# Patient Record
Sex: Male | Born: 1935 | Race: White | Hispanic: No | Marital: Married | Smoking: Former smoker
Health system: Southern US, Community
[De-identification: ages and names within clinical notes are randomized; demographics above are authoritative.]

## PROBLEM LIST (undated history)

## (undated) DIAGNOSIS — M1990 Unspecified osteoarthritis, unspecified site: Secondary | ICD-10-CM

## (undated) DIAGNOSIS — G20 Parkinson's disease: Secondary | ICD-10-CM

## (undated) DIAGNOSIS — G20A1 Dementia in other diseases classified elsewhere, unspecified severity, without behavioral disturbance, psychotic disturbance, mood disturbance, and anxiety: Secondary | ICD-10-CM

## (undated) DIAGNOSIS — F0280 Dementia in other diseases classified elsewhere without behavioral disturbance: Secondary | ICD-10-CM

## (undated) DIAGNOSIS — I680 Cerebral amyloid angiopathy: Secondary | ICD-10-CM

## (undated) DIAGNOSIS — G4752 REM sleep behavior disorder: Secondary | ICD-10-CM

## (undated) DIAGNOSIS — E854 Organ-limited amyloidosis: Secondary | ICD-10-CM

## (undated) DIAGNOSIS — Z974 Presence of external hearing-aid: Secondary | ICD-10-CM

## (undated) HISTORY — PX: COLONOSCOPY: SHX174

## (undated) HISTORY — PX: FINGER SURGERY: SHX640

---

## 2016-09-20 ENCOUNTER — Other Ambulatory Visit: Payer: Self-pay | Admitting: Neurology

## 2016-09-20 DIAGNOSIS — G20 Parkinson's disease: Secondary | ICD-10-CM

## 2016-09-20 DIAGNOSIS — R413 Other amnesia: Secondary | ICD-10-CM

## 2016-09-27 ENCOUNTER — Ambulatory Visit
Admission: RE | Admit: 2016-09-27 | Discharge: 2016-09-27 | Disposition: A | Discharge status: Home / Self Care | Payer: Medicare Other | Source: Ambulatory Visit | Attending: Neurology | Admitting: Neurology

## 2016-09-27 DIAGNOSIS — R413 Other amnesia: Secondary | ICD-10-CM | POA: Diagnosis present

## 2016-09-27 DIAGNOSIS — I6203 Nontraumatic chronic subdural hemorrhage: Secondary | ICD-10-CM | POA: Insufficient documentation

## 2016-09-27 DIAGNOSIS — G20 Parkinson's disease: Secondary | ICD-10-CM

## 2016-12-04 ENCOUNTER — Ambulatory Visit: Payer: Medicare Other | Admitting: Physical Therapy

## 2016-12-04 ENCOUNTER — Ambulatory Visit: Payer: Medicare Other | Admitting: Speech Pathology

## 2016-12-05 ENCOUNTER — Ambulatory Visit: Payer: Medicare Other | Attending: Family Medicine | Admitting: Speech Pathology

## 2016-12-05 DIAGNOSIS — R490 Dysphonia: Secondary | ICD-10-CM | POA: Diagnosis present

## 2016-12-05 DIAGNOSIS — M6281 Muscle weakness (generalized): Secondary | ICD-10-CM | POA: Insufficient documentation

## 2016-12-05 DIAGNOSIS — R262 Difficulty in walking, not elsewhere classified: Secondary | ICD-10-CM | POA: Insufficient documentation

## 2016-12-06 ENCOUNTER — Encounter: Payer: Medicare Other | Admitting: Speech Pathology

## 2016-12-06 ENCOUNTER — Encounter: Payer: Self-pay | Admitting: Speech Pathology

## 2016-12-06 ENCOUNTER — Other Ambulatory Visit: Payer: Self-pay

## 2016-12-06 ENCOUNTER — Ambulatory Visit: Payer: Medicare Other | Admitting: Physical Therapy

## 2016-12-06 NOTE — Therapy (Signed)
[facility] [address] [place], [name], [address] Phone: [phone]   Fax:  [phone]  Speech Language Pathology Evaluation  Patient Details  Name: Darrell Fry MRN: [id number] Date of Birth: [birth year] Referring Provider: Dr. Sherryll BurgerShah   Encounter Date: 12/05/2016  End of Session - 12/06/16 0847    Visit Number  1    Number of Visits  17    Date for SLP Re-Evaluation  01/12/17    SLP Start Time  1500    SLP Stop Time   1558    SLP Time Calculation (min)  58 min    Activity Tolerance  Patient tolerated treatment well       History reviewed. No pertinent past medical history.  History reviewed. No pertinent surgical history.  There were no vitals filed for this visit.      SLP Evaluation [facility] - 12/06/16 0001      SLP Visit Information   SLP Received On  12/05/16    Referring Provider  Dr. Sherryll BurgerShah    Onset Date  09/13/2016    Medical Diagnosis  Parkinsonism       Subjective   Subjective  Patient reports that "only my wife can't hear me"    Patient/Family Stated Goal  Maximize communication      General Information   HPI  83 year old man recently diagnosed with Parkinsonism       Prior Functional Status   Cognitive/Linguistic Baseline  Within functional limits      Oral Motor/Sensory Function   Overall Oral Motor/Sensory Function  Appears within functional limits for tasks assessed      Motor Speech   Overall Motor Speech  Impaired    Respiration  Impaired    Level of Impairment  Conversation    Phonation  Hoarse;Low vocal intensity;Other (comment) Pharyngeal focus    Resonance  Within functional limits    Articulation  Impaired    Level of Impairment  Conversation    Intelligibility  Intelligibility reduced    Conversation  75-100% accurate    Phonation  Impaired    Vocal Abuses  Habitual Cough/Throat Clear    Tension Present  Neck;Shoulder    Volume  Soft    Pitch  Appropriate      Standardized Assessments   Standardized Assessments   Other Assessment LSVT-LOUD Voice Evaluation        LSVT-LOUD Voice Evaluation Maximum phonation time for sustained ah: 10 seconds Mean intensity during sustained ah: 68 dB  Mean intensity sustained during conversational speech: 65 dB Highest dynamic pitch when altering pitch from a low note to a high note: 198 Hz Highest pitch during conversational speech: 146 Hz Lowest dynamic pitch when altering from a high note to a low note: 153 Hz Lowest pitch during conversational speech: 109 Hz  Average fundamental frequency during sustained ah: 131 Hz (within 1 STD for age and gender) Visi-Pitch: Pharmacist, communityMulti-Dimensional Voice Program (MDVP)  MDVP extracts objective quantitative values (Relative Average Perturbation, Shimmer, Voice Turbulence Index, and Noise to Harmonic Ratio) on sustained phonation, which are displayed graphically and numerically in comparison to a built-in normative database.  The patient exhibited values outside the norm for Shimmer.  Average fundamental frequency was average for age and gender. Perceptually, his vocal quality was hoarse and muffled.  The patient improved all parameters when cued to alter voicing (loud like me).  Simulatability: Improved vocal quality with loud voice.   SLP Education - 12/06/16 [id number]  Education provided  Yes    Education Details  LSVT-LOUD protocol    Person(s) Educated  Patient;Spouse    Methods  Explanation    Comprehension  Verbalized understanding         SLP Long Term Goals - 12/06/16 0850      SLP LONG TERM GOAL #1   Title  The patient will complete Daily Tasks (Maximum duration "ah", High/Lows, and Functional Phrases) at average loudness of 80 dB and with loud, good quality voice.     Time  4    Period  Weeks    Status  New    Target Date  01/12/17      SLP LONG TERM GOAL #2   Title  The patient will complete Hierarchal Speech Loudness reading drills (words/phrases,  sentences, and paragraph) at average 75 dB and with loud, good quality voice.      Time  4    Period  Weeks    Status  New    Target Date  01/12/17      SLP LONG TERM GOAL #3   Title  The patient will participate in conversation, maintaining average loudness of 75 dB and loud, good quality voice.    Time  4    Period  Weeks    Status  New    Target Date  01/12/17      SLP LONG TERM GOAL #4   Title  The patient will complete homework daily.    Time  4    Period  Weeks    Status  New    Target Date  01/12/17       Plan - 12/06/16 0848    Clinical Impression Statement  This 83 year old man recently diagnosed with Parkinsonism is presenting with moderate voice disorder characterized by hoarse vocal quality, pharyngeal focus, and monotone voice.  Based on stimulability testing, the patient is judged to be a good candidate for the LSVT LOUD program.  It is recommended that the patient receive the LSVT LOUD program which is comprised of 16 intensive sessions (4 times per week for 4 weeks, one hour sessions).  Prognosis for improvement is good based on motivation, stimulability, and strong family support.  LSVT LOUD has been documented in the literature as efficacious for individuals with Parkinson's disease.      Speech Therapy Frequency  4x / week    Duration  4 weeks    Treatment/Interventions  SLP instruction and feedback;Patient/family education;Other (comment) LSVT-LOUD    Potential to Achieve Goals  Good    Potential Considerations  Ability to learn/carryover information;Severity of impairments;Co-morbidities;Cooperation/participation level;Family/community support;Medical prognosis;Previous level of function    SLP Home Exercise Plan  LSVT-LOUD daily homework    Consulted and Agree with Plan of Care  Patient;Family member/caregiver    Family Member Consulted  Spouse       Patient will benefit from skilled therapeutic intervention in order to improve the following deficits and  impairments:   Dysphonia - Plan: SLP plan of care cert/re-cert  G-Codes - 12/06/16 0851    Functional Assessment Tool Used  LSVT-LOUD Evaluation    Functional Limitations  Voice    Voice Current Status (G9171)  At least 40 percent but less than 60 percent impaired, limited or restricted    Voice Goal Status (G9172)  At least 1 percent but less than 20 percent impaired, limited or restricted       Problem List There are no active problems to display  for this patient.  Dollene Primrose, MS/CCC- SLP  Leandrew Koyanagi 12/06/2016, 8:53 AM  [place] [facility] MAIN [facility] SERVICES [address] [place], [place], [address] Phone: [phone]   Fax:  [phone]  Name: Darrell Fry MRN: [id number] Date of Birth: 08-Jun-1927

## 2016-12-11 ENCOUNTER — Ambulatory Visit: Payer: Medicare Other | Admitting: Physical Therapy

## 2016-12-11 ENCOUNTER — Encounter: Payer: Medicare Other | Admitting: Speech Pathology

## 2016-12-12 ENCOUNTER — Other Ambulatory Visit: Payer: Self-pay

## 2016-12-12 ENCOUNTER — Ambulatory Visit: Payer: Medicare Other | Admitting: Speech Pathology

## 2016-12-12 ENCOUNTER — Ambulatory Visit: Payer: Medicare Other | Admitting: Physical Therapy

## 2016-12-12 ENCOUNTER — Encounter: Payer: Self-pay | Admitting: Physical Therapy

## 2016-12-12 ENCOUNTER — Encounter: Payer: Self-pay | Admitting: Speech Pathology

## 2016-12-12 DIAGNOSIS — M6281 Muscle weakness (generalized): Secondary | ICD-10-CM

## 2016-12-12 DIAGNOSIS — R490 Dysphonia: Secondary | ICD-10-CM

## 2016-12-12 DIAGNOSIS — R262 Difficulty in walking, not elsewhere classified: Secondary | ICD-10-CM

## 2016-12-12 NOTE — Therapy (Signed)
[facility] [address] [place], [name], [address] Phone: [phone]   Fax:  [phone]  Speech Language Pathology Treatment  Patient Details  Name: Darrell Fry MRN: [id number] Date of Birth: June 04, 1927 Referring Provider: Dr. Sherryll BurgerShah   Encounter Date: 12/12/2016  End of Session - 12/12/16 1603    Visit Number  2    Number of Visits  17    Date for SLP Re-Evaluation  01/12/17    SLP Start Time  1500    SLP Stop Time   1553    SLP Time Calculation (min)  53 min    Activity Tolerance  Patient tolerated treatment well       History reviewed. No pertinent past medical history.  History reviewed. No pertinent surgical history.  There were no vitals filed for this visit.  Subjective Assessment - 12/12/16 1602    Subjective  "THat seems a little loud"    Currently in Pain?  No/denies            ADULT SLP TREATMENT - 12/12/16 0001      General Information   Behavior/Cognition  Alert;Cooperative;Pleasant mood    HPI   83 year old man recently diagnosed with Parkinsonism        Treatment Provided   Treatment provided  Cognitive-Linquistic      Pain Assessment   Pain Assessment  No/denies pain      Cognitive-Linquistic Treatment   Treatment focused on  Voice    Skilled Treatment  Daily Task #1 (Maximum sustained "ah"): Average 12 seconds, 83 dB. Daily Task 2 (Maximum fundamental frequency range): Highs: 15 high pitched "ah", given min-mod cues. Lows: 15 low pitched "ah", given min-mod cues. Daily task #3 (Maximum speech loudness drill of functional phrases): Average 72 dB.   Hierarchal speech loudness drill: single word responses, 70 dB. Homework: assignments given.  Off the cuff remarks: average 68 dB.        Assessment / Recommendations / Plan   Plan  Continue with current plan of care      Progression Toward Goals   Progression toward goals  Progressing toward goals       SLP Education - 12/12/16  1603    Education provided  Yes    Education Details  LSVT-LOUD    Person(s) Educated  Patient    Methods  Explanation    Comprehension  Verbalized understanding         SLP Long Term Goals - 12/06/16 0850      SLP LONG TERM GOAL #1   Title  The patient will complete Daily Tasks (Maximum duration "ah", High/Lows, and Functional Phrases) at average loudness of 80 dB and with loud, good quality voice.     Time  4    Period  Weeks    Status  New    Target Date  01/12/17      SLP LONG TERM GOAL #2   Title  The patient will complete Hierarchal Speech Loudness reading drills (words/phrases, sentences, and paragraph) at average 75 dB and with loud, good quality voice.      Time  4    Period  Weeks    Status  New    Target Date  01/12/17      SLP LONG TERM GOAL #3   Title  The patient will participate in conversation, maintaining average loudness of 75 dB and loud, good quality voice.    Time  4  Period  Weeks    Status  New    Target Date  01/12/17      SLP LONG TERM GOAL #4   Title  The patient will complete homework daily.    Time  4    Period  Weeks    Status  New    Target Date  01/12/17       Plan - 12/12/16 1603    Clinical Impression Statement  The patient is completing daily tasks and hierarchal speech drill tasks with loud, good quality voice given SLP cues.      Speech Therapy Frequency  4x / week    Duration  4 weeks    Treatment/Interventions  SLP instruction and feedback;Patient/family education;Other (comment) LSVT-LOUD    Potential to Achieve Goals  Good    Potential Considerations  Ability to learn/carryover information;Severity of impairments;Co-morbidities;Cooperation/participation level;Family/community support;Medical prognosis;Previous level of function    SLP Home Exercise Plan  LSVT-LOUD daily homework    Consulted and Agree with Plan of Care  Patient       Patient will benefit from skilled therapeutic intervention in order to improve the  following deficits and impairments:   Dysphonia    Problem List There are no active problems to display for this patient.  [name], MS/CCC- SLP  [name], [name] 12/12/2016, 4:04 PM  [name], [name], [address] Phone: [phone]   Fax:  [phone]   Name: Darrell Fry MRN: [id number] Date of Birth: 17-Mar-1927

## 2016-12-12 NOTE — Therapy (Signed)
[place] [facility] MAIN [facility] SERVICES [address] [place], [place], [address] Phone: [phone]   Fax:  [phone]  Physical Therapy Evaluation  Patient Details  Name: Darrell Fry MRN: [id number] Date of Birth: October 02, 1927 Referring Provider: Marisue Ivan    Encounter Date: 12/12/2016  PT End of Session - 12/12/16 1412    Visit Number  1    Number of Visits  17    Date for PT Re-Evaluation  01/16/17    Authorization Type  g codes    PT Start Time  0200    PT Stop Time  0300    PT Time Calculation (min)  60 min    Equipment Utilized During Treatment  Gait belt    Activity Tolerance  Patient tolerated treatment well    Behavior During Therapy  [facility] for tasks assessed/performed       History reviewed. No pertinent past medical history.  History reviewed. No pertinent surgical history.  There were no vitals filed for this visit.   Subjective Assessment - 12/12/16 1403    Subjective  Patient reports that he has a short term memory problem and his knees have arthritis. He has slow gait and is unsteady.     Pertinent History  Patient has symptoms of unilateral right hand resting tremor, imbalance, slowness, hunched forward posture, decreased stride length, decreased volume of speech, acting out of dreams.    How long can you stand comfortably?  Patient has limited standing    How long can you walk comfortably?  patient has limited walking of 15 mins    Patient Stated Goals  to be able to walk longer and ascend and descend steps.    Currently in Pain?  Yes    Pain Score  6     Pain Location  Knee    Pain Orientation  Right;Left    Pain Descriptors / Indicators  Aching    Pain Type  Chronic pain    Pain Onset  More than a month ago    Pain Frequency  Intermittent    Aggravating Factors   walking    Pain Relieving Factors  supine and pain medicine    Effect of Pain on Daily Activities  not able to walk very long    Multiple Pain Sites   No         [facility] PT Assessment - 12/12/16 1403      Assessment   Medical Diagnosis  parkinsons    Referring Provider  Marisue Ivan     Onset Date/Surgical Date  11/12/16    Hand Dominance  Right    Prior Therapy  no       Precautions   Precautions  None      Restrictions   Weight Bearing Restrictions  No      Balance Screen   Has the patient fallen in the past 6 months  No    Has the patient had a decrease in activity level because of a fear of falling?   Yes    Is the patient reluctant to leave their home because of a fear of falling?   No      Home Nurse, mental health  Private residence    Living Arrangements  Spouse/significant other    Available Help at Discharge  Family    Type of Home  House    Home Access  Stairs to enter    Entrance Stairs-Number of Steps  1  Entrance Stairs-Rails  None    Home Layout  One level    [employer] - single point;Shower seat      Prior Function   Level of Independence  Independent with basic ADLs;Independent with household mobility without device    Vocation  Retired    Leisure  TV, exercise aquatic      Cognition   Overall Cognitive Status  Within Functional Limits for tasks assessed        POSTURE: Flexed posture  PROM/AROM: WFL  STRENGTH:  Graded on a 0-5 scale Muscle Group Left Right  Shoulder flex 5/5 5/5  Shoulder Abd 5/5 5/5  Shoulder Ext 5/5 5/5  Shoulder IR/ER 5/5 5/5  Elbow 5/5 5/5  Wrist/hand 5/5 5/5  Hip Flex 4/5 4/5  Hip Abd 4/5 4/5  Hip Add 3/5 3/5  Hip Ext 2/5 2/5  Hip IR/ER 4/5 4/5  Knee Flex 5/5 5/5  Knee Ext 5/5 5/5  Ankle DF 5/5 5/5  Ankle PF 3/5 3/5   SENSATION:  WNL   FUNCTIONAL MOBILITY:slow and independent   BALANCE: unable to tandem stand, unable to single leg stand Standing Dynamic Balance  Normal Stand independently unsupported, able to weight shift and cross midline maximally   Good Stand independently unsupported, able to weight shift and cross  midline moderately x  Good-/Fair+ Stand independently unsupported, able to weight shift across midline minimally   Fair Stand independently unsupported, weight shift, and reach ipsilaterally, loss of balance when crossing midline   Poor+ Able to stand with Min A and reach ipsilaterally, unable to weight shift   Poor Able to stand with Mod A and minimally reach ipsilaterally, unable to cross midline.     Static Standing Balance  Normal Able to maintain standing balance against maximal resistance   Good Able to maintain standing balance against moderate resistance x  Good-/Fair+ Able to maintain standing balance against minimal resistance   Fair Able to stand unsupported without UE support and without LOB for 1-2 min   Fair- Requires Min A and UE support to maintain standing without loss of balance   Poor+ Requires mod A and UE support to maintain standing without loss of balance   Poor Requires max A and UE support to maintain standing balance without loss      GAIT: decreased RUE arm swing and fwd flexed posture and decreased gait speed.  OUTCOME MEASURES: TEST Outcome Interpretation  5 times sit<>stand 17 . 32sec >60 yo, >15 sec indicates increased risk for falls  10 meter walk test 1.16                m/s <1.0 m/s indicates increased risk for falls; limited community ambulator  Timed up and Go   13.78,16.43, 20.47              sec <14 sec indicates increased risk for falls  6 minute walk test  930               Feet 1000 feet is community Financial controller 42/56 <36/56 (100% risk for falls), 37-45 (80% risk for falls); 46-51 (>50% risk for falls); 52-55 (lower risk <25% of falls)  DGI 17/24 17/17WNL           Objective measurements completed on examination: See above findings.              PT Education - 12/12/16 1412    Education provided  Yes    Education  Details  plan of care    Person(s) Educated  Patient    Methods  Explanation     Comprehension  Verbalized understanding       PT Short Term Goals - 12/12/16 1444      PT SHORT TERM GOAL #1   Title  Patient will increase BLE gross strength to 4+/5 as to improve functional strength for independent gait, increased standing tolerance and increased ADL ability.    Time  2    Period  Weeks    Status  New    Target Date  12/26/16      PT SHORT TERM GOAL #2   Title  Patient will increase BLE gross strength to 4+/5 as to improve functional strength for independent gait, increased standing tolerance and increased ADL ability.    Baseline  17/24    Time  2    Period  Weeks    Status  New    Target Date  12/26/16        PT Long Term Goals - 12/12/16 1450      PT LONG TERM GOAL #1   Title  Patient will increase Berg Balance score by > 6 points to demonstrate decreased fall risk during functional activities.    Baseline  42/56    Time  4    Period  Weeks    Status  New    Target Date  01/09/17      PT LONG TERM GOAL #2   Title  Patient will reduce timed up and go to <11 seconds to reduce fall risk and demonstrate improved transfer/gait ability.    Time  4    Period  Weeks    Status  New    Target Date  01/09/17      PT LONG TERM GOAL #3   Title  Patient will increase six minute walk test distance to >1000 for progression to community ambulator and improve gait ability    Baseline  930 ft    Time  4    Period  Weeks    Status  New    Target Date  01/09/17      PT LONG TERM GOAL #4   Title  Patient (> 81 years old) will complete five times sit to stand test in < 15 seconds indicating an increased LE strength and improved balance.    Time  4    Period  Weeks    Status  New    Target Date  01/09/17             Plan - 12/12/16 1454    Clinical Impression Statement  Patient is 83 yr old male with Dx of parkinsons disease and presents to PT clinic for LSVT BIG. He has decreased BLE ankle PF strength, decreased dynamic standing balance with decreased  BERG, DGI , 5 x sit to stand, TUG, TUG carry, TUG cognitive and 6 MW test. He has tremor with ambulation and decreased gait speed . He has a falls risk and will benefit from skilled PT to imrpove mobility and decrease his falls risk.     Clinical Presentation  Stable    Clinical Decision Making  Low    Rehab Potential  Good    PT Frequency  4x / week    PT Duration  6 weeks    PT Treatment/Interventions  Gait training;Therapeutic activities;Functional mobility training;Stair training;Therapeutic exercise;Patient/family education;Balance training;Neuromuscular re-education    PT Next Visit Plan  LSVT  BIG    PT Home Exercise Plan  LSVT BIG    Consulted and Agree with Plan of Care  Patient       Patient will benefit from skilled therapeutic intervention in order to improve the following deficits and impairments:  Abnormal gait, Decreased balance, Decreased endurance, Decreased mobility, Decreased activity tolerance, Decreased coordination, Decreased safety awareness, Decreased strength, Impaired flexibility, Pain  Visit Diagnosis: Difficulty in walking, not elsewhere classified - Plan: PT plan of care cert/re-cert  Muscle weakness (generalized) - Plan: PT plan of care cert/re-cert  G-Codes - 12/12/16 1453    Mobility: Walking and Moving Around Current Status (Z6109(G8978)  At least 40 percent but less than 60 percent impaired, limited or restricted    Mobility: Walking and Moving Around Goal Status (U0454(G8979)  At least 20 percent but less than 40 percent impaired, limited or restricted        Problem List There are no active problems to display for this patient.   [address]Elanah Osmanovic S, [place] DPT 12/12/2016, 3:02 PM  [place] [facility] [address] [place], [name], [address] Phone: [phone]   Fax:  [phone]  Name: Darrell Fry MRN: [id number] Date of Birth: 03-20-1927

## 2016-12-13 ENCOUNTER — Ambulatory Visit: Payer: Medicare Other | Admitting: Physical Therapy

## 2016-12-13 ENCOUNTER — Encounter: Payer: Self-pay | Admitting: Physical Therapy

## 2016-12-13 ENCOUNTER — Encounter: Payer: Self-pay | Admitting: Speech Pathology

## 2016-12-13 ENCOUNTER — Other Ambulatory Visit: Payer: Self-pay

## 2016-12-13 ENCOUNTER — Ambulatory Visit: Payer: Medicare Other | Admitting: Speech Pathology

## 2016-12-13 DIAGNOSIS — R490 Dysphonia: Secondary | ICD-10-CM

## 2016-12-13 DIAGNOSIS — R262 Difficulty in walking, not elsewhere classified: Secondary | ICD-10-CM

## 2016-12-13 DIAGNOSIS — M6281 Muscle weakness (generalized): Secondary | ICD-10-CM

## 2016-12-13 NOTE — Therapy (Signed)
[facility] [address] [place], [name], [address] Phone: [phone]   Fax:  [phone]  Speech Language Pathology Treatment  Patient Details  Name: Darrell NormanUwe Economos MRN: [id number] Date of Birth: 02/10/1927 Referring Provider: Dr. Sherryll BurgerShah   Encounter Date: 12/13/2016  End of Session - 12/13/16 1619    Visit Number  3    Number of Visits  17    Date for SLP Re-Evaluation  01/12/17    SLP Start Time  1400    SLP Stop Time   1450    SLP Time Calculation (min)  50 min    Activity Tolerance  Patient tolerated treatment well       History reviewed. No pertinent past medical history.  History reviewed. No pertinent surgical history.  There were no vitals filed for this visit.  Subjective Assessment - 12/12/16 1602    Subjective  "THat seems a little loud"    Currently in Pain?  No/denies            ADULT SLP TREATMENT - 12/13/16 0001      General Information   Behavior/Cognition  Alert;Cooperative;Pleasant mood    HPI   83 year old man recently diagnosed with Parkinsonism        Treatment Provided   Treatment provided  Cognitive-Linquistic      Pain Assessment   Pain Assessment  No/denies pain      Cognitive-Linquistic Treatment   Treatment focused on  Voice    Skilled Treatment  Daily Task #1 (Maximum sustained "ah"): Average 12 seconds, 83 dB. Daily Task 2 (Maximum fundamental frequency range): Highs: 15 high pitched "ah", given min cues. Lows: 15 low pitched "ah", given min cues. Daily task #3 (Maximum speech loudness drill of functional phrases): Average 72 dB.   Hierarchal speech loudness drill: Short spontaneous responses, 70 dB. Homework: assignments given.  Off the cuff remarks: average 70 dB.        Assessment / Recommendations / Plan   Plan  Continue with current plan of care      Progression Toward Goals   Progression toward goals  Progressing toward goals       SLP Education - 12/13/16 1619     Education provided  Yes    Education Details  LSVT-LOUD    Person(s) Educated  Patient    Methods  Explanation    Comprehension  Verbalized understanding         SLP Long Term Goals - 12/06/16 0850      SLP LONG TERM GOAL #1   Title  The patient will complete Daily Tasks (Maximum duration "ah", High/Lows, and Functional Phrases) at average loudness of 80 dB and with loud, good quality voice.     Time  4    Period  Weeks    Status  New    Target Date  01/12/17      SLP LONG TERM GOAL #2   Title  The patient will complete Hierarchal Speech Loudness reading drills (words/phrases, sentences, and paragraph) at average 75 dB and with loud, good quality voice.      Time  4    Period  Weeks    Status  New    Target Date  01/12/17      SLP LONG TERM GOAL #3   Title  The patient will participate in conversation, maintaining average loudness of 75 dB and loud, good quality voice.    Time  4  Period  Weeks    Status  New    Target Date  01/12/17      SLP LONG TERM GOAL #4   Title  The patient will complete homework daily.    Time  4    Period  Weeks    Status  New    Target Date  01/12/17       Plan - 12/13/16 1620    Clinical Impression Statement  The patient is completing daily tasks and hierarchal speech drill tasks with loud, good quality voice given fewer SLP cues.  He demonstrates improved pitch range and requires less cuing for hierarchal speech loudness drill.    Speech Therapy Frequency  4x / week    Duration  4 weeks    Treatment/Interventions  SLP instruction and feedback;Patient/family education;Other (comment) LSVT-LOUD    Potential to Achieve Goals  Good    Potential Considerations  Ability to learn/carryover information;Severity of impairments;Co-morbidities;Cooperation/participation level;Family/community support;Medical prognosis;Previous level of function    SLP Home Exercise Plan  LSVT-LOUD daily homework    Consulted and Agree with Plan of Care  Patient        Patient will benefit from skilled therapeutic intervention in order to improve the following deficits and impairments:   Dysphonia    Problem List There are no active problems to display for this patient.  Dollene PrimroseSusan G Isobelle Tuckett, MS/CCC- SLP  [name], [name] 12/13/2016, 4:21 PM  [facility] [address] [place], [name], [address] Phone: [phone]   Fax:  [phone]   Name: Darrell NormanUwe Chenault MRN: [id number] Date of Birth: 04/06/1927

## 2016-12-13 NOTE — Therapy (Signed)
[facility] [address] [place], [name], [address] Phone: [phone]   Fax:  [phone]  Physical Therapy Treatment  Patient Details  Name: Darrell NormanUwe Sturdy MRN: [id number] Date of Birth: 1927/07/05 Referring Provider: Marisue IvanLINTHAVONG, KANHKA    Encounter Date: 12/13/2016  PT End of Session - 12/13/16 1339    Visit Number  2    Number of Visits  17    Date for PT Re-Evaluation  01/16/17    Authorization Type  g code 2/10    PT Start Time  0105    PT Stop Time  0200    PT Time Calculation (min)  55 min    Equipment Utilized During Treatment  Gait belt    Activity Tolerance  Patient tolerated treatment well    Behavior During Therapy  [facility] for tasks assessed/performed       History reviewed. No pertinent past medical history.  History reviewed. No pertinent surgical history.  There were no vitals filed for this visit.  Subjective Assessment - 12/13/16 1338    Subjective  Patient reports that he has a short term memory problem and his knees have arthritis. He has slow gait and is unsteady.     Pertinent History  Patient has symptoms of unilateral right hand resting tremor, imbalance, slowness, hunched forward posture, decreased stride length, decreased volume of speech, acting out of dreams.    How long can you stand comfortably?  Patient has limited standing    How long can you walk comfortably?  patient has limited walking of 15 mins    Patient Stated Goals  to be able to walk longer and ascend and descend steps.    Currently in Pain?  No/denies    Pain Score  0-No pain    Pain Onset  More than a month ago         [facility] - 12/12/16 1403      Assessment   Medical Diagnosis  parkinsons    Referring Provider  Marisue IvanLINTHAVONG, KANHKA     Onset Date/Surgical Date  11/12/16    Hand Dominance  Right    Prior Therapy  no       Precautions   Precautions  None      Restrictions   Weight Bearing Restrictions   No      Balance Screen   Has the patient fallen in the past 6 months  No    Has the patient had a decrease in activity level because of a fear of falling?   Yes    Is the patient reluctant to leave their home because of a fear of falling?   No      Home Public house managernvironment   Living Environment  Private residence    Living Arrangements  Spouse/significant other    Available Help at Discharge  Family    Type of Home  House    Home Access  Stairs to enter    Entrance Stairs-Number of Steps  1    Entrance Stairs-Rails  None    Home Layout  One level    Home Equipment  [place] - single point;Shower seat      Prior Function   Level of Independence  Independent with basic ADLs;Independent with household mobility without device    Vocation  Retired    Leisure  TV, exercise aquatic      Cognition   Overall Cognitive Status  Within Functional Limits for tasks assessed  Patient seen for LSVT Daily Session Maximal Daily Exercises for facilitation/coordination of movement Sustained movements are designed to rescale the amplitude of movement output for generalization to daily functional activities .Performed as follows for 1 set of 10 repetitions each multidirectional sustained movements  1) Floor to ceiling  2) Side to side multidirectional Repetitive movements performed in standing and are designed to provide retraining effort needed for sustained muscle activation in tasks    3) Step and reach forward  4) Step and reach backwards  5) Step and reach sideways  6) Rock and reach forward/backward  7) Rock and reach sideways 1.Sit to stand functional component task with supervision 5 reps  2.simulated activities for: hand coordination tasks such as buttons  3. Putting his left ankle on his right knee to be able to tie his shoes 4. Standing balance activity for improved standing balance : tapping a 6 inch stool x 20 reps 5. Single leg  Standing activities for being able to wash his feet in the  shower    Max cueing needed to appropriately perform LSVT tasks with leg, hand, and head position. Decreased coordination demonstrated requiring consistent verbal cueing to correct form.                   PT Education - 12/13/16 1339    Education provided  Yes    Education Details  LSVT BIG    Person(s) Educated  Patient    Methods  Explanation    Comprehension  Verbalized understanding       PT Short Term Goals - 12/12/16 1444      PT SHORT TERM GOAL #1   Title  Patient will increase BLE gross strength to 4+/5 as to improve functional strength for independent gait, increased standing tolerance and increased ADL ability.    Time  2    Period  Weeks    Status  New    Target Date  12/26/16      PT SHORT TERM GOAL #2   Title  Patient will increase BLE gross strength to 4+/5 as to improve functional strength for independent gait, increased standing tolerance and increased ADL ability.    Baseline  17/24    Time  2    Period  Weeks    Status  New    Target Date  12/26/16        PT Long Term Goals - 12/12/16 1450      PT LONG TERM GOAL #1   Title  Patient will increase Berg Balance score by > 6 points to demonstrate decreased fall risk during functional activities.    Baseline  42/56    Time  4    Period  Weeks    Status  New    Target Date  01/09/17      PT LONG TERM GOAL #2   Title  Patient will reduce timed up and go to <11 seconds to reduce fall risk and demonstrate improved transfer/gait ability.    Time  4    Period  Weeks    Status  New    Target Date  01/09/17      PT LONG TERM GOAL #3   Title  Patient will increase six minute walk test distance to >1000 for progression to community ambulator and improve gait ability    Baseline  930 ft    Time  4    Period  Weeks    Status  New  Target Date  01/09/17      PT LONG TERM GOAL #4   Title  Patient (> 224 years old) will complete five times sit to stand test in < 15 seconds indicating an  increased LE strength and improved balance.    Time  4    Period  Weeks    Status  New    Target Date  01/09/17            Plan - 12/13/16 1347    Clinical Impression Statement  Max cueing needed to appropriately perform LSVT tasks with leg, hand, and head position. Decreased coordination demonstrated requiring consistent verbal cueing to correct form. Cognitive understanding of task was delayed. Patient continues to demonstrate some in coordination of movement with select exercises such as rock and reach and stepping backwards. Patient responds well to verbal and tactile cues to correct form and technique.  CGA to SBA for safety with activities.  Cues to increase intensity and amplitude of movements throughout session    Rehab Potential  Good    PT Frequency  4x / week    PT Duration  6 weeks    PT Treatment/Interventions  Gait training;Therapeutic activities;Functional mobility training;Stair training;Therapeutic exercise;Patient/family education;Balance training;Neuromuscular re-education    PT Next Visit Plan  LSVT BIG    PT Home Exercise Plan  LSVT BIG    Consulted and Agree with Plan of Care  Patient       Patient will benefit from skilled therapeutic intervention in order to improve the following deficits and impairments:  Abnormal gait, Decreased balance, Decreased endurance, Decreased mobility, Decreased activity tolerance, Decreased coordination, Decreased safety awareness, Decreased strength, Impaired flexibility, Pain  Visit Diagnosis: Difficulty in walking, not elsewhere classified  Muscle weakness (generalized)   G-Codes - 12/12/16 1453    Mobility: Walking and Moving Around Current Status ([id number])  At least 40 percent but less than 60 percent impaired, limited or restricted    Mobility: Walking and Moving Around Goal Status (U0454(G8979)  At least 20 percent but less than 40 percent impaired, limited or restricted       Problem List There are no active problems to  display for this patient.   [address] StreetM[place]field, Brizeyda Holtmeyer S, [place]PT DPT 12/13/2016, 1:48 PM  [facility] [address] [place], [name], [address] Phone: [phone]   Fax:  [phone]  Name: Darrell NormanUwe Cada MRN: [id number] Date of Birth: 1927-10-12

## 2016-12-14 ENCOUNTER — Other Ambulatory Visit: Payer: Self-pay

## 2016-12-14 ENCOUNTER — Ambulatory Visit: Payer: Medicare Other | Admitting: Physical Therapy

## 2016-12-14 ENCOUNTER — Encounter: Payer: Self-pay | Admitting: Speech Pathology

## 2016-12-14 ENCOUNTER — Ambulatory Visit: Payer: Medicare Other | Admitting: Speech Pathology

## 2016-12-14 ENCOUNTER — Encounter: Payer: Self-pay | Admitting: Physical Therapy

## 2016-12-14 DIAGNOSIS — M6281 Muscle weakness (generalized): Secondary | ICD-10-CM

## 2016-12-14 DIAGNOSIS — R490 Dysphonia: Secondary | ICD-10-CM

## 2016-12-14 DIAGNOSIS — R262 Difficulty in walking, not elsewhere classified: Secondary | ICD-10-CM

## 2016-12-14 NOTE — Therapy (Signed)
[facility] [address] [place], [name], [address] Phone: [phone]   Fax:  [phone]  Speech Language Pathology Treatment  Patient Details  Name: Darrell Fry MRN: [id number] Date of Birth: March 17, 1927 Referring Provider: Dr. Sherryll BurgerShah   Encounter Date: 12/14/2016  End of Session - 12/14/16 1502    Visit Number  4    Number of Visits  17    Date for SLP Re-Evaluation  01/12/17    SLP Start Time  1400    SLP Stop Time   1452    SLP Time Calculation (min)  52 min    Activity Tolerance  Patient tolerated treatment well       History reviewed. No pertinent past medical history.  History reviewed. No pertinent surgical history.  There were no vitals filed for this visit.  Subjective Assessment - 12/14/16 1500    Subjective  The patient states that he is trying to be louder    Currently in Pain?  No/denies            ADULT SLP TREATMENT - 12/14/16 0001      General Information   Behavior/Cognition  Alert;Cooperative;Pleasant mood    HPI   83 year old man recently diagnosed with Parkinsonism        Treatment Provided   Treatment provided  Cognitive-Linquistic      Pain Assessment   Pain Assessment  No/denies pain      Cognitive-Linquistic Treatment   Treatment focused on  Voice    Skilled Treatment  Daily Task #1 (Maximum sustained "ah"): Average 12 seconds, 83 dB. Daily Task 2 (Maximum fundamental frequency range): Highs: 15 high pitched "ah", given min cues. Lows: 15 low pitched "ah", given min cues. Daily task #3 (Maximum speech loudness drill of functional phrases): Average 73 dB.   Hierarchal speech loudness drill: Short spontaneous responses, 73 dB. Homework: assignments given.  Off the cuff remarks: average 70 dB.        Assessment / Recommendations / Plan   Plan  Continue with current plan of care      Progression Toward Goals   Progression toward goals  Progressing toward goals       SLP  Education - 12/14/16 1501    Education provided  Yes    Education Details  LSVT-LOUD    Person(s) Educated  Patient    Methods  Explanation    Comprehension  Verbalized understanding         SLP Long Term Goals - 12/06/16 0850      SLP LONG TERM GOAL #1   Title  The patient will complete Daily Tasks (Maximum duration "ah", High/Lows, and Functional Phrases) at average loudness of 80 dB and with loud, good quality voice.     Time  4    Period  Weeks    Status  New    Target Date  01/12/17      SLP LONG TERM GOAL #2   Title  The patient will complete Hierarchal Speech Loudness reading drills (words/phrases, sentences, and paragraph) at average 75 dB and with loud, good quality voice.      Time  4    Period  Weeks    Status  New    Target Date  01/12/17      SLP LONG TERM GOAL #3   Title  The patient will participate in conversation, maintaining average loudness of 75 dB and loud, good quality voice.  Time  4    Period  Weeks    Status  New    Target Date  01/12/17      SLP LONG TERM GOAL #4   Title  The patient will complete homework daily.    Time  4    Period  Weeks    Status  New    Target Date  01/12/17       Plan - 12/14/16 1503    Clinical Impression Statement  The patient is completing daily tasks and hierarchal speech drill tasks with loud, good quality voice given fewer SLP cues.  He demonstrates improved pitch range and requires less cuing for hierarchal speech loudness drill.    Speech Therapy Frequency  4x / week    Duration  4 weeks    Treatment/Interventions  SLP instruction and feedback;Patient/family education;Other (comment) LSVT-LOUD    Potential to Achieve Goals  Good    Potential Considerations  Ability to learn/carryover information;Severity of impairments;Co-morbidities;Cooperation/participation level;Family/community support;Medical prognosis;Previous level of function    SLP Home Exercise Plan  LSVT-LOUD daily homework    Consulted and Agree  with Plan of Care  Patient       Patient will benefit from skilled therapeutic intervention in order to improve the following deficits and impairments:   Dysphonia    Problem List There are no active problems to display for this patient.  Dollene PrimroseSusan G [place] Prue, MS/CCC- SLP  [name], [name] 12/14/2016, 3:03 PM  [place] [address] [place], [name], [address] Phone: [phone]   Fax:  [phone]   Name: Darrell Fry

## 2016-12-14 NOTE — Therapy (Signed)
[facility] [address] [place], [name], [address] Phone: [phone]   Fax:  [phone]  Physical Therapy Treatment  Patient Details  Name: Darrell NormanUwe Delbene MRN: [id number] Date of Birth: 1927-04-23 Referring Provider: Marisue IvanLINTHAVONG, KANHKA    Encounter Date: 12/14/2016  PT End of Session - 12/14/16 1334    Visit Number  3    Number of Visits  17    Date for PT Re-Evaluation  01/16/17    Authorization Type  g code 3/10    PT Start Time  0105    PT Stop Time  0200    PT Time Calculation (min)  55 min    Equipment Utilized During Treatment  Gait belt    Activity Tolerance  Patient tolerated treatment well;Patient limited by fatigue    Behavior During Therapy  [facility]/per[facility]       History reviewed. No pertinent past medical history.  History reviewed. No pertinent surgical history.  There were no vitals filed for this visit.  Subjective Assessment - 12/14/16 1333    Subjective  Patient reports that he has a short term memory problem and his knees have arthritis. He has slow gait and is unsteady.     Pertinent History  Patient has symptoms of unilateral right hand resting tremor, imbalance, slowness, hunched forward posture, decreased stride length, decreased volume of speech, acting out of dreams.    How long can you stand comfortably?  Patient has limited standing    How long can you walk comfortably?  patient has limited walking of 15 mins    Patient Stated Goals  to be able to walk longer and ascend and descend steps.    Currently in Pain?  Yes    Pain Score  2     Pain Location  Knee    Pain Orientation  Right;Left    Pain Descriptors / Indicators  Aching    Pain Type  Chronic pain    Pain Onset  More than a month ago    Pain Frequency  Intermittent    Aggravating Factors   walking    Pain Relieving Factors  lying down and pain medication    Effect of Pain on Daily Activities  not able to walk  as long       Patient seen for LSVT Daily Session Maximal Daily Exercises for facilitation/coordination of movement Sustained movements are designed to rescale the amplitude of movement output for generalization to daily functional activities .Performed as follows for 1 set of 10 repetitions each multidirectional sustained movements  1) Floor to ceiling ; cues to reach fwd  2) Side to side multidirectional Repetitive movements performed in standing and are designed to provide retraining effort needed for sustained muscle activation in tasks   3) Step and reach forward ; cues to step back to finish position correctly with his feet even 4) Step and reach backwards ; cues to lift his toe and bring both UE's back 5) Step and reach sideways; cues to turn his head  6) Rock and reach forward/backward , cues to alternately swing his arms 7) Rock and reach sideways, with twist with cues to pivot on opposite LE 1.Sit to stand functional component task with supervision 5 reps ; cues to reach fwd and to lean fwd 2.simulated activities for: hand coordination tasks such as buttons ; using cards to turn and shuffle 3. Putting his left ankle on his right knee to be able  to tie his shoes x 5 BLE 4. Standing balance activity for improved standing balance : tapping a 6 inch stool x 20 reps, step ups on 6 inch stool 5. Single leg  Standing activities for being able to wash his feet in the shower; in parallel bars without using UE's    Max cueing needed to appropriately perform LSVT tasks with leg, hand, and head position. Decreased coordination demonstrated requiring consistent verbal cueing to correct form.                       PT Education - 12/14/16 1334    Education provided  Yes    Education Details  LSVT BIG    Person(s) Educated  Patient    Methods  Explanation;Demonstration;Tactile cues;Verbal cues    Comprehension  Verbalized understanding;Returned demonstration;Verbal cues  required       PT Short Term Goals - 12/12/16 1444      PT SHORT TERM GOAL #1   Title  Patient will increase BLE gross strength to 4+/5 as to improve functional strength for independent gait, increased standing tolerance and increased ADL ability.    Time  2    Period  Weeks    Status  New    Target Date  12/26/16      PT SHORT TERM GOAL #2   Title  Patient will increase BLE gross strength to 4+/5 as to improve functional strength for independent gait, increased standing tolerance and increased ADL ability.    Baseline  17/24    Time  2    Period  Weeks    Status  New    Target Date  12/26/16        PT Long Term Goals - 12/12/16 1450      PT LONG TERM GOAL #1   Title  Patient will increase Berg Balance score by > 6 points to demonstrate decreased fall risk during functional activities.    Baseline  42/56    Time  4    Period  Weeks    Status  New    Target Date  01/09/17      PT LONG TERM GOAL #2   Title  Patient will reduce timed up and go to <11 seconds to reduce fall risk and demonstrate improved transfer/gait ability.    Time  4    Period  Weeks    Status  New    Target Date  01/09/17      PT LONG TERM GOAL #3   Title  Patient will increase six minute walk test distance to >1000 for progression to community ambulator and improve gait ability    Baseline  930 ft    Time  4    Period  Weeks    Status  New    Target Date  01/09/17      PT LONG TERM GOAL #4   Title  Patient (> 81 years old) will complete five times sit to stand test in < 15 seconds indicating an increased LE strength and improved balance.    Time  4    Period  Weeks    Status  New    Target Date  01/09/17            Plan - 12/14/16 1335    Clinical Impression Statement  Max cueing needed to appropriately  perform LSVT tasks with leg, hand, and head position. Decreased coordination demonstrated requiring consistent verbal cueing to correct  form. Cognitive understanding of task was   delayed. Motor control of LE much improved.  Muscle fatigue but no major pain complaints    Rehab Potential  Good    PT Frequency  4x / week    PT Duration  6 weeks    PT Treatment/Interventions  Gait training;Therapeutic activities;Functional mobility training;Stair training;Therapeutic exercise;Patient/family education;Balance training;Neuromuscular re-education    PT Next Visit Plan  LSVT BIG    PT Home Exercise Plan  LSVT BIG    Consulted and Agree with Plan of Care  Patient       Patient will benefit from skilled therapeutic intervention in order to improve the following deficits and impairments:  Abnormal gait, Decreased balance, Decreased endurance, Decreased mobility, Decreased activity tolerance, Decreased coordination, Decreased safety awareness, Decreased strength, Impaired flexibility, Pain  Visit Diagnosis: Difficulty in walking, not elsewhere classified  Muscle weakness (generalized)     Problem List There are no active problems to display for this patient.   [address] [place] DPT 12/14/2016, 1:37 PM  [facility] MAIN [facility] SERVICES [address] [place], [place], [address] Phone: [phone]   Fax:  [phone]  Name: Remiel Corti MRN: [id number] Date of Birth: Aug 24, 1927

## 2016-12-18 ENCOUNTER — Encounter: Payer: Self-pay | Admitting: Speech Pathology

## 2016-12-18 ENCOUNTER — Ambulatory Visit: Payer: Medicare Other | Admitting: Physical Therapy

## 2016-12-18 ENCOUNTER — Ambulatory Visit: Payer: Medicare Other | Admitting: Speech Pathology

## 2016-12-18 ENCOUNTER — Other Ambulatory Visit: Payer: Self-pay

## 2016-12-18 ENCOUNTER — Encounter: Payer: Self-pay | Admitting: Physical Therapy

## 2016-12-18 DIAGNOSIS — M6281 Muscle weakness (generalized): Secondary | ICD-10-CM

## 2016-12-18 DIAGNOSIS — R262 Difficulty in walking, not elsewhere classified: Secondary | ICD-10-CM

## 2016-12-18 DIAGNOSIS — R490 Dysphonia: Secondary | ICD-10-CM | POA: Diagnosis not present

## 2016-12-18 NOTE — Therapy (Signed)
[facility] [address] [place], [name], [address] Phone: [phone]   Fax:  [phone]  Speech Language Pathology Treatment  Patient Details  Name: Darrell Fry MRN: [id number] Date of Birth: 28-Mar-1927 Referring Provider: Dr. Sherryll BurgerShah   Encounter Date: 12/18/2016  End of Session - 12/18/16 1502    Visit Number  5    Number of Visits  17    Date for SLP Re-Evaluation  01/12/17    SLP Start Time  1400    SLP Stop Time   1452    SLP Time Calculation (min)  52 min    Activity Tolerance  Patient tolerated treatment well       History reviewed. No pertinent past medical history.  History reviewed. No pertinent surgical history.  There were no vitals filed for this visit.  Subjective Assessment - 12/18/16 1501    Subjective  The patient states that he is trying to be louder    Currently in Pain?  No/denies            ADULT SLP TREATMENT - 12/18/16 0001      General Information   Behavior/Cognition  Alert;Cooperative;Pleasant mood    HPI   83 year old man recently diagnosed with Parkinsonism        Treatment Provided   Treatment provided  Cognitive-Linquistic      Pain Assessment   Pain Assessment  No/denies pain      Cognitive-Linquistic Treatment   Treatment focused on  Voice    Skilled Treatment  Daily Task #1 (Maximum sustained "ah"): Average 9 seconds, 80 dB. Daily Task 2 (Maximum fundamental frequency range): Highs: 15 high pitched "ah", given min cues. Lows: 15 low pitched "ah", given min cues. Daily task #3 (Maximum speech loudness drill of functional phrases): Average 73 dB.   Hierarchal speech loudness drill: Short spontaneous responses, 73 dB. Homework: assignments given.  Off the cuff remarks: average 70 dB.        Assessment / Recommendations / Plan   Plan  Continue with current plan of care      Progression Toward Goals   Progression toward goals  Progressing toward goals       SLP  Education - 12/18/16 1502    Education provided  Yes    Education Details  LSVT-LOUD    Person(s) Educated  Patient    Methods  Explanation    Comprehension  Verbalized understanding         SLP Long Term Goals - 12/06/16 0850      SLP LONG TERM GOAL #1   Title  The patient will complete Daily Tasks (Maximum duration "ah", High/Lows, and Functional Phrases) at average loudness of 80 dB and with loud, good quality voice.     Time  4    Period  Weeks    Status  New    Target Date  01/12/17      SLP LONG TERM GOAL #2   Title  The patient will complete Hierarchal Speech Loudness reading drills (words/phrases, sentences, and paragraph) at average 75 dB and with loud, good quality voice.      Time  4    Period  Weeks    Status  New    Target Date  01/12/17      SLP LONG TERM GOAL #3   Title  The patient will participate in conversation, maintaining average loudness of 75 dB and loud, good quality voice.  Time  4    Period  Weeks    Status  New    Target Date  01/12/17      SLP LONG TERM GOAL #4   Title  The patient will complete homework daily.    Time  4    Period  Weeks    Status  New    Target Date  01/12/17       Plan - 12/18/16 1502    Clinical Impression Statement  The patient is completing daily tasks and hierarchal speech drill tasks with loud, good quality voice given fewer SLP cues.  He demonstrates improved pitch range and requires less cuing for hierarchal speech loudness drill.    Speech Therapy Frequency  4x / week    Duration  4 weeks    Treatment/Interventions  SLP instruction and feedback;Patient/family education;Other (comment) LSVT-LOUD    Potential to Achieve Goals  Good    Potential Considerations  Ability to learn/carryover information;Severity of impairments;Co-morbidities;Cooperation/participation level;Family/community support;Medical prognosis;Previous level of function    SLP Home Exercise Plan  LSVT-LOUD daily homework    Consulted and Agree  with Plan of Care  Patient       Patient will benefit from skilled therapeutic intervention in order to improve the following deficits and impairments:   Dysphonia    Problem List There are no active problems to display for this patient.  Dollene PrimroseSusan G Lakyra Tippins, MS/CCC- SLP  [name], [name] 12/18/2016, 3:03 PM  [facility] [address] [place], [name], [address] Phone: [phone]   Fax:  [phone]   Name: Darrell NormanUwe Fry MRN: [id number] Date of Birth: Aug 20, 1927

## 2016-12-18 NOTE — Therapy (Signed)
[facility] MAIN [facility] SERVICES [address] [place], [place], [address] Phone: [phone]   Fax:  [phone]  Physical Therapy Treatment  Patient Details  Name: Darrell Fry MRN: [id number] Date of Birth: Apr 15, 1927 Referring Provider: Marisue Ivan    Encounter Date: 12/18/2016  PT End of Session - 12/18/16 1310    Visit Number  4    Number of Visits  17    Date for PT Re-Evaluation  01/16/17    Authorization Type  g code 4/10    PT Start Time  0103    PT Stop Time  0200    PT Time Calculation (min)  57 min    Equipment Utilized During Treatment  Gait belt    Activity Tolerance  Patient tolerated treatment well;Patient limited by fatigue    Behavior During Therapy  [facility] for tasks assessed/performed       History reviewed. No pertinent past medical history.  History reviewed. No pertinent surgical history.  There were no vitals filed for this visit.  Subjective Assessment - 12/18/16 1308    Subjective  Patient reports that he has a short term memory problem and his knees have arthritis. He has slow gait and is unsteady. He reports that he was able to do his exercise 2 x a day over the last 3 days.     Pertinent History  Patient has symptoms of unilateral right hand resting tremor, imbalance, slowness, hunched forward posture, decreased stride length, decreased volume of speech, acting out of dreams.    How long can you stand comfortably?  Patient has limited standing    How long can you walk comfortably?  patient has limited walking of 15 mins    Patient Stated Goals  to be able to walk longer and ascend and descend steps.    Currently in Pain?  No/denies    Pain Score  0-No pain    Pain Onset  More than a month ago         Patient seen for LSVT Daily Session Maximal Daily Exercises for facilitation/coordination of movement Sustained movements are designed to rescale the amplitude of movement output for generalization to  daily functional activities .Performed as follows for 1 set of 10 repetitions each multidirectional sustained movements  1) Floor to ceiling ; cues to reach fwd and to lean BIG 2) Side to side multidirectional Repetitive movements performed in standing and are designed to provide retraining effort needed for sustained muscle activation in tasks ; cues to stretch out his leg BIG 3) Step and reach forward ; cues to step back to finish position correctly with his feet even and not to take such a big step that he is getting  Off balance 4) Step and reach backwards ; cues to lift his toe and bring both UE's back 5) Step and reach sideways; cues to turn his head and raise both UE's 6) Rock and reach forward/backward , cues to alternately swing his arms, start by rocking and then adding in the arm swing 7) Rock and reach sideways, with twist with cues to pivot on opposite LE cue to look behind 1.Sit to stand functional component task with supervision 5 reps; cues to reach fwd and to lean fwd, cues for hand up, stand up 2.simulated activities for: hand coordination tasks such as buttons; using cards to turn and shuffle 3. Putting his left ankle on his right knee to be able to tie his shoes x 5 BLE 4.  Standing balance activity for improved standing balance : tapping a 6 inch stool x 20 reps, step ups on 6 inch stool 5. Single leg Standing activities for being able to wash his feet in the shower; in parallel bars without using UE's  Gait with BIG swing 1000 feet with cues to swing big   Max cueing needed to appropriately perform LSVT tasks with leg, hand, and head position. Decreased coordination demonstrated requiring consistent verbal cueing to correct form.Needs cues to turn his head to look at his hand and then to turn his head back to start position. Needs cues to rotate his arms all the way so his palms are facing the ceiling.                      PT Education - 12/18/16  1309    Education provided  Yes    Education Details  LSVT BIG    Person(s) Educated  Patient    Methods  Explanation;Demonstration    Comprehension  Verbalized understanding;Returned demonstration;Verbal cues required;Tactile cues required       PT Short Term Goals - 12/12/16 1444      PT SHORT TERM GOAL #1   Title  Patient will increase BLE gross strength to 4+/5 as to improve functional strength for independent gait, increased standing tolerance and increased ADL ability.    Time  2    Period  Weeks    Status  New    Target Date  12/26/16      PT SHORT TERM GOAL #2   Title  Patient will increase BLE gross strength to 4+/5 as to improve functional strength for independent gait, increased standing tolerance and increased ADL ability.    Baseline  17/24    Time  2    Period  Weeks    Status  New    Target Date  12/26/16        PT Long Term Goals - 12/12/16 1450      PT LONG TERM GOAL #1   Title  Patient will increase Berg Balance score by > 6 points to demonstrate decreased fall risk during functional activities.    Baseline  42/56    Time  4    Period  Weeks    Status  New    Target Date  01/09/17      PT LONG TERM GOAL #2   Title  Patient will reduce timed up and go to <11 seconds to reduce fall risk and demonstrate improved transfer/gait ability.    Time  4    Period  Weeks    Status  New    Target Date  01/09/17      PT LONG TERM GOAL #3   Title  Patient will increase six minute walk test distance to >1000 for progression to community ambulator and improve gait ability    Baseline  930 ft    Time  4    Period  Weeks    Status  New    Target Date  01/09/17      PT LONG TERM GOAL #4   Title  Patient (> 81 years old) will complete five times sit to stand test in < 15 seconds indicating an increased LE strength and improved balance.    Time  4    Period  Weeks    Status  New    Target Date  01/09/17  Plan - 12/18/16 1311    Clinical  Impression Statement  Continues to have balance deficits typical with diagnosis. Patient performs intermediate level exercises without pain behaviors and needs verbal cuing for postural alignment and head positioning. Cuing is needed to stretch the leg out in seated side reaching. Fatigue with sit to stand but demonstrating more control .    Rehab Potential  Good    PT Frequency  4x / week    PT Duration  6 weeks    PT Treatment/Interventions  Gait training;Therapeutic activities;Functional mobility training;Stair training;Therapeutic exercise;Patient/family education;Balance training;Neuromuscular re-education    PT Next Visit Plan  LSVT BIG    PT Home Exercise Plan  LSVT BIG    Consulted and Agree with Plan of Care  Patient       Patient will benefit from skilled therapeutic intervention in order to improve the following deficits and impairments:  Abnormal gait, Decreased balance, Decreased endurance, Decreased mobility, Decreased activity tolerance, Decreased coordination, Decreased safety awareness, Decreased strength, Impaired flexibility, Pain  Visit Diagnosis: Difficulty in walking, not elsewhere classified  Muscle weakness (generalized)     Problem List There are no active problems to display for this patient.   [address]Tiaja Hagan S, [place],  DPT  12/18/2016, 1:12 PM  [place] [facility] [address] [place], [name], [address] Phone: [phone]   Fax:  [phone]  Name: Darrell NormanUwe Fry MRN: [id number] Date of Birth: 11-18-1927

## 2016-12-19 ENCOUNTER — Ambulatory Visit: Payer: Medicare Other | Admitting: Physical Therapy

## 2016-12-19 ENCOUNTER — Encounter: Payer: Self-pay | Admitting: Physical Therapy

## 2016-12-19 ENCOUNTER — Ambulatory Visit: Payer: Medicare Other | Admitting: Speech Pathology

## 2016-12-19 DIAGNOSIS — R490 Dysphonia: Secondary | ICD-10-CM | POA: Diagnosis not present

## 2016-12-19 DIAGNOSIS — R262 Difficulty in walking, not elsewhere classified: Secondary | ICD-10-CM

## 2016-12-19 DIAGNOSIS — M6281 Muscle weakness (generalized): Secondary | ICD-10-CM

## 2016-12-19 NOTE — Therapy (Signed)
[place]IN [facility] [address] [place], [name], [address] Phone: [phone]   Fax:  [phone]  Physical Therapy Treatment  Patient Details  Name: Darrell Fry MRN: [id number] Date of Birth: [birth year] Referring Provider: Marisue IvanLINTHAVONG, KANHKA    Encounter Date: 12/19/2016  PT End of Session - 12/19/16 1313    Visit Number  5    Number of Visits  17    Date for PT Re-Evaluation  01/16/17    Authorization Type  g code 5/10    PT Start Time  0103    PT Stop Time  0200    PT Time Calculation (min)  57 min    Equipment Utilized During Treatment  Gait belt    Activity Tolerance  Patient tolerated treatment well;Patient limited by fatigue    Behavior During Therapy  [facility]WFL for tasks assessed/performed       History reviewed. No pertinent past medical history.  History reviewed. No pertinent surgical history.  There were no vitals filed for this visit.  Subjective Assessment - 12/19/16 1311    Subjective  Patient is doing his HEP and has some soreness in his knees.     Pertinent History  Patient has symptoms of unilateral right hand resting tremor, imbalance, slowness, hunched forward posture, decreased stride length, decreased volume of speech, acting out of dreams.    How long can you stand comfortably?  Patient has limited standing    How long can you walk comfortably?  patient has limited walking of 15 mins    Patient Stated Goals  to be able to walk longer and ascend and descend steps.    Currently in Pain?  Yes    Pain Score  2     Pain Location  Knee    Pain Orientation  Right;Left    Pain Descriptors / Indicators  Aching    Pain Type  Chronic pain    Pain Onset  More than a month ago    Pain Frequency  Intermittent    Aggravating Factors   walking    Pain Relieving Factors  pain medication    Effect of Pain on Daily Activities  needs to sit more    Multiple Pain Sites  No        Patient seen for LSVT Daily Session  Maximal Daily Exercises for facilitation/coordination of movement Sustained movements are designed to rescale the amplitude of movement output for generalization to daily functional activities .Performed as follows for 1 set of 10 repetitions each multidirectional sustained movements  1) Floor to ceiling; cues to reach fwdand to lean BIG 2) Side to side multidirectional Repetitive movements performed in standing and are designed to provide retraining effort needed for sustained muscle activation in tasks ; cues to stretch out his leg BIG 3) Step and reach forward; cues to step back to finish position correctly with his feet even and not to take such a big step that he is getting  Off balance 4) Step and reach backwards; cues to lift his toe and bring both UE's back 5) Step and reach sideways; cues to turn his head and raise both UE's 6) Rock and reach forward/backward, cues to alternately swing his arms, start by rocking and then adding in the arm swing 7) Rock and reach sideways, with twist with cues to pivot on opposite LE cue to look behind 1.Sit to stand functional component task with supervision 5 reps; cues to reach fwd and to lean  fwd, cues for hand up, stand up 2.simulated activities for: hand coordination tasks such as buttons; using cards to turn and shuffle 3. Putting his left ankle on his right knee to be able to tie his shoesx 5 BLE 4. Standing balance activity for improved standing balance : tapping a 6 inch stool x 20 reps, step ups on 6 inch stool 5. Single leg Standing activities for being able to wash his feet in the shower; in parallel bars without using UE's  Gait with BIG swing 1000 feet with cues to swing big   Max cueing needed to appropriately perform LSVT tasks with leg, hand, and head position. Decreased coordination demonstrated requiring consistent verbal cueing to correct form.Needs cues to turn his head to look at his hand and then to turn his head back  to start position. Needs cues to rotate his arms all the way so his palms are facing the ceiling                        PT Education - 12/19/16 1312    Education provided  Yes    Education Details  LSVT BIG    Person(s) Educated  Patient    Methods  Explanation;Tactile cues;Demonstration    Comprehension  Verbalized understanding;Returned demonstration       PT Short Term Goals - 12/12/16 1444      PT SHORT TERM GOAL #1   Title  Patient will increase BLE gross strength to 4+/5 as to improve functional strength for independent gait, increased standing tolerance and increased ADL ability.    Time  2    Period  Weeks    Status  New    Target Date  12/26/16      PT SHORT TERM GOAL #2   Title  Patient will increase BLE gross strength to 4+/5 as to improve functional strength for independent gait, increased standing tolerance and increased ADL ability.    Baseline  17/24    Time  2    Period  Weeks    Status  New    Target Date  12/26/16        PT Long Term Goals - 12/12/16 1450      PT LONG TERM GOAL #1   Title  Patient will increase Berg Balance score by > 6 points to demonstrate decreased fall risk during functional activities.    Baseline  42/56    Time  4    Period  Weeks    Status  New    Target Date  01/09/17      PT LONG TERM GOAL #2   Title  Patient will reduce timed up and go to <11 seconds to reduce fall risk and demonstrate improved transfer/gait ability.    Time  4    Period  Weeks    Status  New    Target Date  01/09/17      PT LONG TERM GOAL #3   Title  Patient will increase six minute walk test distance to >1000 for progression to community ambulator and improve gait ability    Baseline  930 ft    Time  4    Period  Weeks    Status  New    Target Date  01/09/17      PT LONG TERM GOAL #4   Title  Patient (83 years old) will complete five times sit to stand test in < 15 seconds indicating an  increased LE strength and improved  balance.    Time  4    Period  Weeks    Status  New    Target Date  01/09/17            Plan - 12/19/16 1314    Clinical Impression Statement  Max cueing needed to appropriately perform LSVT tasks with leg, hand, and head position. Decreased coordination demonstrated requiring consistent verbal cueing to correct form. Cognitive understanding of task was delayed. Patient continues to demonstrate some in coordination of movement with select exercises such as rock and reach and stepping backwards. Patient responds well to verbal and tactile cues to correct form and technique. CGA to SBA for safety with activities. Cues to increase intensity and amplitude of movements throughout session    Rehab Potential  Good    PT Frequency  4x / week    PT Duration  6 weeks    PT Treatment/Interventions  Gait training;Therapeutic activities;Functional mobility training;Stair training;Therapeutic exercise;Patient/family education;Balance training;Neuromuscular re-education    PT Next Visit Plan  LSVT BIG    PT Home Exercise Plan  LSVT BIG    Consulted and Agree with Plan of Care  Patient       Patient will benefit from skilled therapeutic intervention in order to improve the following deficits and impairments:  Abnormal gait, Decreased balance, Decreased endurance, Decreased mobility, Decreased activity tolerance, Decreased coordination, Decreased safety awareness, Decreased strength, Impaired flexibility, Pain  Visit Diagnosis: Difficulty in walking, not elsewhere classified  Muscle weakness (generalized)     Problem List There are no active problems to display for this patient.   [address], [place] Binford S, [place], DPT 12/19/2016, 1:16 PM  [place] [facility] [address] [place], [name], [address] Phone: [phone]   Fax:  [phone]  Name: Darrell Fry MRN: [id number] Date of Birth: 07-Jul-1927

## 2016-12-20 ENCOUNTER — Encounter: Payer: Self-pay | Admitting: Speech Pathology

## 2016-12-20 ENCOUNTER — Ambulatory Visit: Payer: Medicare Other | Admitting: Physical Therapy

## 2016-12-20 ENCOUNTER — Ambulatory Visit: Payer: Medicare Other | Admitting: Speech Pathology

## 2016-12-20 ENCOUNTER — Encounter: Payer: Self-pay | Admitting: Physical Therapy

## 2016-12-20 ENCOUNTER — Other Ambulatory Visit: Payer: Self-pay

## 2016-12-20 DIAGNOSIS — R262 Difficulty in walking, not elsewhere classified: Secondary | ICD-10-CM

## 2016-12-20 DIAGNOSIS — R490 Dysphonia: Secondary | ICD-10-CM

## 2016-12-20 DIAGNOSIS — M6281 Muscle weakness (generalized): Secondary | ICD-10-CM

## 2016-12-20 NOTE — Therapy (Addendum)
[employer] [facility] [address] [place], [name], [address] Phone: [phone]   Fax:  [phone]  Physical Therapy Treatment  Patient Details  Name: Darrell Fry MRN: [id number] Date of Birth: [birth year] Referring Provider: Marisue IvanLINTHAVONG, KANHKA    Encounter Date: 12/20/2016  PT End of Session - 12/20/16 1310    Visit Number  6    Number of Visits  17    Date for PT Re-Evaluation  01/16/17    Authorization Type  g code 6/10    PT Start Time  0100    PT Stop Time  0200    PT Time Calculation (min)  60 min    Equipment Utilized During Treatment  Gait belt    Activity Tolerance  Patient tolerated treatment well;Patient limited by fatigue    Behavior During Therapy  [facility] for tasks assessed/performed       History reviewed. No pertinent past medical history.  History reviewed. No pertinent surgical history.  There were no vitals filed for this visit.  Subjective Assessment - 12/20/16 1309    Subjective  Patient is doing his HEP and has some soreness in his knees.     Pertinent History  Patient has symptoms of unilateral right hand resting tremor, imbalance, slowness, hunched forward posture, decreased stride length, decreased volume of speech, acting out of dreams.    How long can you stand comfortably?  Patient has limited standing    How long can you walk comfortably?  patient has limited walking of 15 mins    Patient Stated Goals  to be able to walk longer and ascend and descend steps.    Currently in Pain?  Yes    Pain Score  2     Pain Location  Knee    Pain Orientation  Right;Left    Pain Descriptors / Indicators  Aching    Pain Type  Chronic pain    Pain Onset  More than a month ago    Pain Frequency  Intermittent    Aggravating Factors   walking    Pain Relieving Factors  pain medication    Multiple Pain Sites  No       Patient seen for LSVT Daily Session Maximal Daily Exercises for facilitation/coordination of  movement Sustained movements are designed to rescale the amplitude of movement output for generalization to daily functional activities .Performed as follows for 1 set of 10 repetitions each multidirectional sustained movements  1) Floor to ceiling; cues to reach fwdand to lean BIG 2) Side to side multidirectional Repetitive movements performed in standing and are designed to provide retraining effort needed for sustained muscle activation in tasks; cues to stretch out his leg BIG 3) Step and reach forward; cues to step back to finish position correctly with his feet evenand not to take such a big step that he is getting Off balance 4) Step and reach backwards; cues to lift his toe and bring both UE's back 5) Step and reach sideways; cues to turn his headand raise both UE's 6) Rock and reach forward/backward, cues to alternately swing his arms, start by rocking and then adding in the arm swing 7) Rock and reach sideways, with twist with cues to pivot on opposite LEcue to look behind 1.Sit to stand functional component task with supervision 5 reps; cues to reach fwd and to lean fwd, cues for hand up, stand up 2.simulated activities for: hand coordination tasks such as buttons; using cards to turn  and shuffle 3. Putting his left ankle on his right knee to be able to tie his shoesx 5 BLE 4. Standing balance activity for improved standing balance : tapping a 6 inch stool x 20 reps, step ups on 6 inch stool 5. Single leg Standing activities for being able to wash his feet in the shower; in parallel bars without using UE's  Gait with BIG swing 1000 feet with cues to swing big Patient needs cueing for BIG swing and BIG amplitude.  Patient needs cueing for correct BIG arm swing. Patient  improves with practice. Patient needs cuing to consistently swing arms and also swing reciprocally. Patient needs cuing to swing his UE's reciprocally with weight shift exercise and with backward stepping  and correct UE positioning. Patient has unsteady stepping with several exercises but his motor control improve with practice. Patient needs occasional verbal cueing to improve posture and cueing to correctly perform exercises slowly, holding at end of range to increase motor firing of desired muscle to encourage fatigue.                        PT Education - 12/20/16 1310    Education provided  Yes    Education Details  LSVT BIG    Person(s) Educated  Patient    Methods  Demonstration;Explanation    Comprehension  Verbalized understanding;Returned demonstration       PT Short Term Goals - 12/12/16 1444      PT SHORT TERM GOAL #1   Title  Patient will increase BLE gross strength to 4+/5 as to improve functional strength for independent gait, increased standing tolerance and increased ADL ability.    Time  2    Period  Weeks    Status  New    Target Date  12/26/16      PT SHORT TERM GOAL #2   Title  Patient will increase BLE gross strength to 4+/5 as to improve functional strength for independent gait, increased standing tolerance and increased ADL ability.    Baseline  17/24    Time  2    Period  Weeks    Status  New    Target Date  12/26/16        PT Long Term Goals - 12/12/16 1450      PT LONG TERM GOAL #1   Title  Patient will increase Berg Balance score by > 6 points to demonstrate decreased fall risk during functional activities.    Baseline  42/56    Time  4    Period  Weeks    Status  New    Target Date  01/09/17      PT LONG TERM GOAL #2   Title  Patient will reduce timed up and go to <11 seconds to reduce fall risk and demonstrate improved transfer/gait ability.    Time  4    Period  Weeks    Status  New    Target Date  01/09/17      PT LONG TERM GOAL #3   Title  Patient will increase six minute walk test distance to >1000 for progression to community ambulator and improve gait ability    Baseline  930 ft    Time  4    Period  Weeks     Status  New    Target Date  01/09/17      PT LONG TERM GOAL #4   Title  Patient (> 60  years old) will complete five times sit to stand test in < 15 seconds indicating an increased LE strength and improved balance.    Time  4    Period  Weeks    Status  New    Target Date  01/09/17            Plan - 12/20/16 1312    Clinical Impression Statement  Patient has difficulty with turning  head and rotating  trunk with weight shifting exercises. Patient has fatigue with standing exercises and needs constant VC to have correct posture. Patient has loss of balance and needs UE support intermittently thorough out exercise. Patient has slowness of movement during rotation and beginning movements.    Rehab Potential  Good    PT Frequency  4x / week    PT Duration  6 weeks    PT Treatment/Interventions  Gait training;Therapeutic activities;Functional mobility training;Stair training;Therapeutic exercise;Patient/family education;Balance training;Neuromuscular re-education    PT Next Visit Plan  LSVT BIG    PT Home Exercise Plan  LSVT BIG    Consulted and Agree with Plan of Care  Patient       Patient will benefit from skilled therapeutic intervention in order to improve the following deficits and impairments:  Abnormal gait, Decreased balance, Decreased endurance, Decreased mobility, Decreased activity tolerance, Decreased coordination, Decreased safety awareness, Decreased strength, Impaired flexibility, Pain  Visit Diagnosis: Difficulty in walking, not elsewhere classified  Muscle weakness (generalized)     Problem List There are no active problems to display for this patient.   [address] [place] DPT 12/20/2016, 1:13 PM  [place] [facility] MAIN [facility] SERVICES [address] [place], [place], [address] Phone: [phone]   Fax:  [phone]  Name: Darrell Fry MRN: [id number] Date of Birth: [birth year]

## 2016-12-20 NOTE — Therapy (Signed)
[place] [facility] [address] [place], [name], [address] Phone: [phone]   Fax:  [phone]  Speech Language Pathology Treatment  Patient Details  Name: Darrell NormanUwe Mittelstaedt MRN: [id number] Date of Birth: 1927/10/24 Referring Provider: Dr. Sherryll BurgerShah   Encounter Date: 12/19/2016  End of Session - 12/20/16 1233    Visit Number  6    Number of Visits  17    Date for SLP Re-Evaluation  01/12/17    SLP Start Time  1400    SLP Stop Time   1453    SLP Time Calculation (min)  53 min    Activity Tolerance  Patient tolerated treatment well       History reviewed. No pertinent past medical history.  History reviewed. No pertinent surgical history.  There were no vitals filed for this visit.  Subjective Assessment - 12/20/16 1232    Subjective  The patient states that he is trying to be louder    Currently in Pain?  No/denies            ADULT SLP TREATMENT - 12/20/16 0001      General Information   Behavior/Cognition  Alert;Cooperative;Pleasant mood    HPI   83 year old man recently diagnosed with Parkinsonism        Treatment Provided   Treatment provided  Cognitive-Linquistic      Pain Assessment   Pain Assessment  No/denies pain      Cognitive-Linquistic Treatment   Treatment focused on  Voice    Skilled Treatment  Daily Task #1 (Maximum sustained "ah"): Average 9 seconds, 84 dB. Daily Task 2 (Maximum fundamental frequency range): Highs: 15 high pitched "ah", given min cues. Lows: 15 low pitched "ah", given min cues. Daily task #3 (Maximum speech loudness drill of functional phrases): Average 73 dB.   Hierarchal speech loudness drill: Short spontaneous responses, 73 dB. Homework: assignments completed.  Off the cuff remarks: average 70 dB.        Assessment / Recommendations / Plan   Plan  Continue with current plan of care      Progression Toward Goals   Progression toward goals  Progressing toward goals       SLP  Education - 12/20/16 1232    Education provided  Yes    Education Details  LSVT-LOUD    Person(s) Educated  Patient    Methods  Explanation    Comprehension  Verbalized understanding         SLP Long Term Goals - 12/06/16 0850      SLP LONG TERM GOAL #1   Title  The patient will complete Daily Tasks (Maximum duration "ah", High/Lows, and Functional Phrases) at average loudness of 80 dB and with loud, good quality voice.     Time  4    Period  Weeks    Status  New    Target Date  01/12/17      SLP LONG TERM GOAL #2   Title  The patient will complete Hierarchal Speech Loudness reading drills (words/phrases, sentences, and paragraph) at average 75 dB and with loud, good quality voice.      Time  4    Period  Weeks    Status  New    Target Date  01/12/17      SLP LONG TERM GOAL #3   Title  The patient will participate in conversation, maintaining average loudness of 75 dB and loud, good quality voice.  Time  4    Period  Weeks    Status  New    Target Date  01/12/17      SLP LONG TERM GOAL #4   Title  The patient will complete homework daily.    Time  4    Period  Weeks    Status  New    Target Date  01/12/17       Plan - 12/20/16 1233    Clinical Impression Statement  The patient is completing daily tasks and hierarchal speech drill tasks with loud, good quality voice given fewer SLP cues.  He demonstrates improved pitch range and requires less cuing for hierarchal speech loudness drill.    Speech Therapy Frequency  4x / week    Duration  4 weeks    Treatment/Interventions  SLP instruction and feedback;Patient/family education;Other (comment) LSVT-LOUD    Potential to Achieve Goals  Good    Potential Considerations  Ability to learn/carryover information;Severity of impairments;Co-morbidities;Cooperation/participation level;Family/community support;Medical prognosis;Previous level of function    SLP Home Exercise Plan  LSVT-LOUD daily homework    Consulted and Agree  with Plan of Care  Patient       Patient will benefit from skilled therapeutic intervention in order to improve the following deficits and impairments:   Dysphonia    Problem List There are no active problems to display for this patient.  Dollene PrimroseSusan G Kaniah Rizzolo, MS/CCC- SLP  [name], [name] 12/20/2016, 12:34 PM  [place] [facility] [address] [place], [name], [address] Phone: [phone]   Fax:  [phone]   Name: Darrell NormanUwe Rathe MRN: [id number] Date of Birth: [birth year]

## 2016-12-20 NOTE — Therapy (Signed)
[facility] [address] [place], [name], [address] Phone: [phone]   Fax:  [phone]  Speech Language Pathology Treatment  Patient Details  Name: Darrell Fry MRN: [id number] Date of Birth: 09-Feb-1927 Referring Provider: Dr. Sherryll BurgerShah   Encounter Date: 12/20/2016  End of Session - 12/20/16 1519    Visit Number  7    Number of Visits  17    Date for SLP Re-Evaluation  01/12/17    SLP Start Time  1400    SLP Stop Time   1450    SLP Time Calculation (min)  50 min    Activity Tolerance  Patient tolerated treatment well       No past medical history on file.  No past surgical history on file.  There were no vitals filed for this visit.  Subjective Assessment - 12/20/16 1519    Subjective  The patient states that he is trying to be louder    Currently in Pain?  No/denies            ADULT SLP TREATMENT - 12/20/16 1518      General Information   Behavior/Cognition  Alert;Cooperative;Pleasant mood    HPI   [age] year old man recently diagnosed with Parkinsonism        Treatment Provided   Treatment provided  Cognitive-Linquistic      Pain Assessment   Pain Assessment  No/denies pain      Cognitive-Linquistic Treatment   Treatment focused on  Voice    Skilled Treatment  Daily Task #1 (Maximum sustained "ah"): Average 9 seconds, 84 dB. Daily Task 2 (Maximum fundamental frequency range): Highs: 15 high pitched "ah", given min cues. Lows: 15 low pitched "ah", given min cues. Daily task #3 (Maximum speech loudness drill of functional phrases): Average 73 dB.   Hierarchal speech loudness drill: Short spontaneous responses, 73 dB. Homework: assignments completed.  Off the cuff remarks: average 70 dB.        Assessment / Recommendations / Plan   Plan  Continue with current plan of care      Progression Toward Goals   Progression toward goals  Progressing toward goals       SLP Education - 12/20/16 1519    Education provided  Yes    Education Details  LSVT-LOUD    Person(s) Educated  Patient    Methods  Explanation    Comprehension  Verbalized understanding         SLP Long Term Goals - 12/06/16 0850      SLP LONG TERM GOAL #1   Title  The patient will complete Daily Tasks (Maximum duration "ah", High/Lows, and Functional Phrases) at average loudness of 80 dB and with loud, good quality voice.     Time  4    Period  Weeks    Status  New    Target Date  01/12/17      SLP LONG TERM GOAL #2   Title  The patient will complete Hierarchal Speech Loudness reading drills (words/phrases, sentences, and paragraph) at average 75 dB and with loud, good quality voice.      Time  4    Period  Weeks    Status  New    Target Date  01/12/17      SLP LONG TERM GOAL #3   Title  The patient will participate in conversation, maintaining average loudness of 75 dB and loud, good quality voice.    Time  4    Period  Weeks    Status  New    Target Date  01/12/17      SLP LONG TERM GOAL #4   Title  The patient will complete homework daily.    Time  4    Period  Weeks    Status  New    Target Date  01/12/17       Plan - 12/20/16 1520    Clinical Impression Statement  The patient is completing daily tasks and hierarchal speech drill tasks with loud, good quality voice given fewer SLP cues.  He demonstrates improved pitch range and requires less cuing for hierarchal speech loudness drill.    Speech Therapy Frequency  4x / week    Duration  4 weeks    Treatment/Interventions  SLP instruction and feedback;Patient/family education;Other (comment) LSVT-LOUD    Potential to Achieve Goals  Good    Potential Considerations  Ability to learn/carryover information;Severity of impairments;Co-morbidities;Cooperation/participation level;Family/community support;Medical prognosis;Previous level of function    SLP Home Exercise Plan  LSVT-LOUD daily homework    Consulted and Agree with Plan of Care  Patient        Patient will benefit from skilled therapeutic intervention in order to improve the following deficits and impairments:   Dysphonia    Problem List There are no active problems to display for this patient.  Dollene PrimroseSusan G Ludwig Tugwell, MS/CCC- SLP  [name], [name] 12/20/2016, 3:22 PM  [facility] [address] [place], [name], [address] Phone: [phone]   Fax:  [phone]   Name: Darrell Fry MRN: [id number] Date of Birth: 1927-06-20

## 2016-12-21 ENCOUNTER — Ambulatory Visit: Payer: Medicare Other | Admitting: Physical Therapy

## 2016-12-21 ENCOUNTER — Encounter: Payer: Self-pay | Admitting: Physical Therapy

## 2016-12-21 ENCOUNTER — Ambulatory Visit: Payer: Medicare Other | Admitting: Speech Pathology

## 2016-12-21 ENCOUNTER — Encounter: Payer: Self-pay | Admitting: Speech Pathology

## 2016-12-21 ENCOUNTER — Other Ambulatory Visit: Payer: Self-pay

## 2016-12-21 DIAGNOSIS — R262 Difficulty in walking, not elsewhere classified: Secondary | ICD-10-CM

## 2016-12-21 DIAGNOSIS — M6281 Muscle weakness (generalized): Secondary | ICD-10-CM

## 2016-12-21 DIAGNOSIS — R490 Dysphonia: Secondary | ICD-10-CM

## 2016-12-21 NOTE — Therapy (Addendum)
[place] [facility] [address] [place], [name], [address] Phone: [phone]   Fax:  [phone]  Physical Therapy Treatment  Patient Details  Name: Darrell NormanUwe Mehra MRN: [id number] Date of Birth: Jun 19, 1927 Referring Provider: Marisue IvanLINTHAVONG, KANHKA    Encounter Date: 12/21/2016  PT End of Session - 12/21/16 1305    Visit Number  7    Number of Visits  17    Date for PT Re-Evaluation  01/16/17    Authorization Type  g code 7/10    PT Start Time  0100    PT Stop Time  0145    PT Time Calculation (min)  45 min    Equipment Utilized During Treatment  Gait belt    Activity Tolerance  Patient tolerated treatment well;Patient limited by fatigue    Behavior During Therapy  [facility] CenterWFL for tasks assessed/performed       History reviewed. No pertinent past medical history.  History reviewed. No pertinent surgical history.  There were no vitals filed for this visit.  Subjective Assessment - 12/21/16 1303    Subjective  Patient is doing his HEP and has some soreness in his knees.     Pertinent History  Patient has symptoms of unilateral right hand resting tremor, imbalance, slowness, hunched forward posture, decreased stride length, decreased volume of speech, acting out of dreams.    How long can you stand comfortably?  Patient has limited standing    How long can you walk comfortably?  patient has limited walking of 15 mins    Patient Stated Goals  to be able to walk longer and ascend and descend steps.    Currently in Pain?  No/denies    Pain Score  0-No pain    Pain Onset  More than a month ago    Multiple Pain Sites  No       Patient seen for LSVT Daily Session Maximal Daily Exercises for facilitation/coordination of movement Sustained movements are designed to rescale the amplitude of movement output for generalization to daily functional activities .Performed as follows for 1 set of 10 repetitions each multidirectional sustained  movements  1) Floor to ceiling; cues to reach fwdand to lean BIG 2) Side to side multidirectional Repetitive movements performed in standing and are designed to provide retraining effort needed for sustained muscle activation in tasks; cues to stretch out his leg BIG 3) Step and reach forward; cues to step back to finish position correctly with his feet evenand not to take such a big step that he is getting Off balance 4) Step and reach backwards; cues to lift his toe and bring both UE's back 5) Step and reach sideways; cues to turn his headand raise both UE's 6) Rock and reach forward/backward, cues to alternately swing his arms, start by rocking and then adding in the arm swing 7) Rock and reach sideways, with twist with cues to pivot on opposite LEcue to look behind 1.Sit to stand functional component task with supervision 5 reps; cues to reach fwd and to lean fwd, cues for hand up, stand up 2.simulated activities for: hand coordination tasks such as buttons; using cards to turn and shuffle 3. Putting his left ankle on his right knee to be able to tie his shoesx 5 BLE 4. Standing balance activity for improved standing balance : tapping a 6 inch stool x 20 reps, step ups on 6 inch stool 5. Single leg Standing activities for being able to wash his feet  in the shower; in parallel bars without using UE's  Gait with BIG swing 1000 feet with cues to swing big Patient needs cueing for BIG swing and BIG amplitude.  Patient needs cueing for correct BIG arm swing. Patient  improves with practice. Patient needs cuing to consistently swing  arms and also swing reciprocally. Patient needs cuing to swing his UE's reciprocally with weight shift exercise and with backward stepping and correct UE positioning. Patient has unsteady stepping with several exercises but his motor control improve with practice. Patient needs occasional verbal cueing to improve posture and cueing to correctly perform  exercises slowly, holding at end of range to increase motor firing of desired muscle to encourage fatigue.                        PT Education - 12/21/16 1304    Education provided  Yes    Education Details  LSVT BIG    Person(s) Educated  Patient    Methods  Explanation;Demonstration;Tactile cues;Verbal cues    Comprehension  Verbalized understanding;Returned demonstration       PT Short Term Goals - 12/12/16 1444      PT SHORT TERM GOAL #1   Title  Patient will increase BLE gross strength to 4+/5 as to improve functional strength for independent gait, increased standing tolerance and increased ADL ability.    Time  2    Period  Weeks    Status  New    Target Date  12/26/16      PT SHORT TERM GOAL #2   Title  Patient will increase BLE gross strength to 4+/5 as to improve functional strength for independent gait, increased standing tolerance and increased ADL ability.    Baseline  17/24    Time  2    Period  Weeks    Status  New    Target Date  12/26/16        PT Long Term Goals - 12/12/16 1450      PT LONG TERM GOAL #1   Title  Patient will increase Berg Balance score by > 6 points to demonstrate decreased fall risk during functional activities.    Baseline  42/56    Time  4    Period  Weeks    Status  New    Target Date  01/09/17      PT LONG TERM GOAL #2   Title  Patient will reduce timed up and go to <11 seconds to reduce fall risk and demonstrate improved transfer/gait ability.    Time  4    Period  Weeks    Status  New    Target Date  01/09/17      PT LONG TERM GOAL #3   Title  Patient will increase six minute walk test distance to >1000 for progression to community ambulator and improve gait ability    Baseline  930 ft    Time  4    Period  Weeks    Status  New    Target Date  01/09/17      PT LONG TERM GOAL #4   Title  Patient (> 81 years old) will complete five times sit to stand test in < 15 seconds indicating an increased LE  strength and improved balance.    Time  4    Period  Weeks    Status  New    Target Date  01/09/17  Plan - 12/21/16 1306    Clinical Impression Statement  Patient has difficulty with turning  head and rotating  trunk with weight shifting exercises. Patient has fatigue with standing exercises and needs constant VC to have correct posture. Patient has loss of balance and needs UE support intermittently thorough out exercise. Patient has slowness of movement during rotation and beginning movements.    Rehab Potential  Good    PT Frequency  4x / week    PT Duration  6 weeks    PT Treatment/Interventions  Gait training;Therapeutic activities;Functional mobility training;Stair training;Therapeutic exercise;Patient/family education;Balance training;Neuromuscular re-education    PT Next Visit Plan  LSVT BIG    PT Home Exercise Plan  LSVT BIG    Consulted and Agree with Plan of Care  Patient       Patient will benefit from skilled therapeutic intervention in order to improve the following deficits and impairments:  Abnormal gait, Decreased balance, Decreased endurance, Decreased mobility, Decreased activity tolerance, Decreased coordination, Decreased safety awareness, Decreased strength, Impaired flexibility, Pain  Visit Diagnosis: Muscle weakness (generalized)  Difficulty in walking, not elsewhere classified     Problem List There are no active problems to display for this patient.   [address], Gregary Blackard S, [place], DPT 12/21/2016, 1:08 PM  [place] [address] [place], [name], [address] Phone: [phone]   Fax:  [phone]  Name: Darrell NormanUwe Fitzmaurice MRN: [id number] Date of Birth: September 10, 1927

## 2016-12-21 NOTE — Therapy (Signed)
[place] [facility] [address] [place], [name], [address] Phone: [phone]   Fax:  [phone]  Speech Language Pathology Treatment  Patient Details  Name: Darrell NormanUwe Hettinger MRN: [id number] Date of Birth: September 28, 1927 Referring Provider: Dr. Sherryll BurgerShah   Encounter Date: 12/21/2016  End of Session - 12/21/16 1456    Visit Number  8    Number of Visits  17    Date for SLP Re-Evaluation  01/12/17    SLP Start Time  1400    SLP Stop Time   1450    SLP Time Calculation (min)  50 min    Activity Tolerance  Patient tolerated treatment well       History reviewed. No pertinent past medical history.  History reviewed. No pertinent surgical history.  There were no vitals filed for this visit.  Subjective Assessment - 12/21/16 1455    Subjective  The patient states that he is trying to be louder    Currently in Pain?  No/denies            ADULT SLP TREATMENT - 12/21/16 0001      General Information   Behavior/Cognition  Alert;Cooperative;Pleasant mood    HPI   83 year old man recently diagnosed with Parkinsonism        Treatment Provided   Treatment provided  Cognitive-Linquistic      Pain Assessment   Pain Assessment  No/denies pain      Cognitive-Linquistic Treatment   Treatment focused on  Voice    Skilled Treatment  Daily Task #1 (Maximum sustained "ah"): Average 9 seconds, 84 dB. Daily Task 2 (Maximum fundamental frequency range): Highs: 15 high pitched "ah", given fewer cues. Lows: 15 low pitched "ah", given fewer cues. Daily task #3 (Maximum speech loudness drill of functional phrases): Average 73 dB.   Hierarchal speech loudness drill: Short spontaneous responses, 73 dB. Homework: assignments completed.  Off the cuff remarks: average 71 dB.        Assessment / Recommendations / Plan   Plan  Continue with current plan of care      Progression Toward Goals   Progression toward goals  Progressing toward goals        SLP Education - 12/21/16 1456    Education provided  Yes    Education Details  LSVT-LOUD    Person(s) Educated  Patient    Methods  Explanation    Comprehension  Verbalized understanding         SLP Long Term Goals - 12/06/16 0850      SLP LONG TERM GOAL #1   Title  The patient will complete Daily Tasks (Maximum duration "ah", High/Lows, and Functional Phrases) at average loudness of 80 dB and with loud, good quality voice.     Time  4    Period  Weeks    Status  New    Target Date  01/12/17      SLP LONG TERM GOAL #2   Title  The patient will complete Hierarchal Speech Loudness reading drills (words/phrases, sentences, and paragraph) at average 75 dB and with loud, good quality voice.      Time  4    Period  Weeks    Status  New    Target Date  01/12/17      SLP LONG TERM GOAL #3   Title  The patient will participate in conversation, maintaining average loudness of 75 dB and loud, good quality voice.  Time  4    Period  Weeks    Status  New    Target Date  01/12/17      SLP LONG TERM GOAL #4   Title  The patient will complete homework daily.    Time  4    Period  Weeks    Status  New    Target Date  01/12/17       Plan - 12/20/16 1520    Clinical Impression Statement  The patient is completing daily tasks and hierarchal speech drill tasks with loud, good quality voice given fewer SLP cues.  He demonstrates improved pitch range and requires less cuing for hierarchal speech loudness drill.    Speech Therapy Frequency  4x / week    Duration  4 weeks    Treatment/Interventions  SLP instruction and feedback;Patient/family education;Other (comment) LSVT-LOUD    Potential to Achieve Goals  Good    Potential Considerations  Ability to learn/carryover information;Severity of impairments;Co-morbidities;Cooperation/participation level;Family/community support;Medical prognosis;Previous level of function    SLP Home Exercise Plan  LSVT-LOUD daily homework    Consulted  and Agree with Plan of Care  Patient       Patient will benefit from skilled therapeutic intervention in order to improve the following deficits and impairments:   Dysphonia    Problem List There are no active problems to display for this patient.  Dollene PrimroseSusan G Rayya Yagi, MS/CCC- SLP  [name], [name] 12/21/2016, 2:57 PM  [place] [address] [place], [name], [address] Phone: [phone]   Fax:  [phone]   Name: Darrell NormanUwe Shedd MRN: [id number] Date of Birth: June 22, 1927

## 2016-12-27 ENCOUNTER — Encounter: Payer: Self-pay | Admitting: Physical Therapy

## 2016-12-27 ENCOUNTER — Ambulatory Visit: Payer: Medicare Other | Admitting: Physical Therapy

## 2016-12-27 ENCOUNTER — Encounter: Payer: Medicare Other | Admitting: Speech Pathology

## 2016-12-27 DIAGNOSIS — R262 Difficulty in walking, not elsewhere classified: Secondary | ICD-10-CM

## 2016-12-27 DIAGNOSIS — M6281 Muscle weakness (generalized): Secondary | ICD-10-CM

## 2016-12-27 DIAGNOSIS — R490 Dysphonia: Secondary | ICD-10-CM | POA: Diagnosis not present

## 2016-12-27 NOTE — Therapy (Signed)
[place] [facility] [address] [place], [name], [address] Phone: [phone]   Fax:  [phone]  Physical Therapy Treatment  Patient Details  Name: Darrell Fry MRN: [id number] Date of Birth: 1927-08-14 Referring Provider: Marisue IvanLINTHAVONG, KANHKA    Encounter Date: 12/27/2016  PT End of Session - 12/27/16 1118    Visit Number  8    Number of Visits  17    Date for PT Re-Evaluation  01/16/17    Authorization Type  g code 8/10    PT Start Time  1100    PT Stop Time  1200    PT Time Calculation (min)  60 min    Equipment Utilized During Treatment  Gait belt    Activity Tolerance  Patient tolerated treatment well;Patient limited by fatigue    Behavior During Therapy  [facility] CenterWFL for tasks assessed/performed       History reviewed. No pertinent past medical history.  History reviewed. No pertinent surgical history.  There were no vitals filed for this visit.    Patient seen for LSVT Daily Session Maximal Daily Exercises for facilitation/coordination of movement Sustained movements are designed to rescale the amplitude of movement output for generalization to daily functional activities .Performed as follows for 1 set of 10 repetitions each multidirectional sustained movements  1) Floor to ceiling; cues to reach fwdand to lean BIG 2) Side to side multidirectional Repetitive movements performed in standing and are designed to provide retraining effort needed for sustained muscle activation in tasks; cues to stretch out his leg BIG 3) Step and reach forward; cues to step back to finish position correctly with his feet evenand not to take such a big step that he is getting Off balance 4) Step and reach backwards; cues to lift his toe and bring both UE's back 5) Step and reach sideways; cues to turn his headand raise both UE's 6) Rock and reach forward/backward, cues to alternately swing his arms, start by rocking and then adding in  the arm swing 7) Rock and reach sideways, with twist with cues to pivot on opposite LEcue to look behind 1.Sit to stand functional component task with supervision 5 reps; cues to reach fwd and to lean fwd, cues for hand up, stand up 2.simulated activities for: hand coordination tasks such as buttons; using cards to turn and shuffle 3. Putting his left ankle on his right knee to be able to tie his shoesx 5 BLE 4. Standing balance activity for improved standing balance : tapping a 6 inch stool x 20 reps, step ups on 6 inch stool 5. Single leg Standing activities for being able to wash his feet in the shower; in parallel bars without using UE's  Gait with BIG swing 1000 feet with cues to swing big Patient needs cueing for BIG swing and BIG amplitude.  Patient needs increased  Cueing today  for correct BIG arm swing. Patient  improves with practice. Patient needs cuing to consistently swing  arms and also swing reciprocally. Patient needs cuing to swing his UE's reciprocally with weight shift exercise and with backward stepping and correct UE positioning. Patient has unsteady stepping with several exercises but his motor control improve with practice                        PT Education - 12/27/16 1118    Education provided  Yes    Education Details  lsvt big    Person(s) Educated  Patient    Methods  Explanation;Demonstration;Tactile cues;Verbal cues    Comprehension  Verbalized understanding;Returned demonstration       PT Short Term Goals - 12/12/16 1444      PT SHORT TERM GOAL #1   Title  Patient will increase BLE gross strength to 4+/5 as to improve functional strength for independent gait, increased standing tolerance and increased ADL ability.    Time  2    Period  Weeks    Status  New    Target Date  12/26/16      PT SHORT TERM GOAL #2   Title  Patient will increase BLE gross strength to 4+/5 as to improve functional strength for independent gait,  increased standing tolerance and increased ADL ability.    Baseline  17/24    Time  2    Period  Weeks    Status  New    Target Date  12/26/16        PT Long Term Goals - 12/12/16 1450      PT LONG TERM GOAL #1   Title  Patient will increase Berg Balance score by > 6 points to demonstrate decreased fall risk during functional activities.    Baseline  42/56    Time  4    Period  Weeks    Status  New    Target Date  01/09/17      PT LONG TERM GOAL #2   Title  Patient will reduce timed up and go to <11 seconds to reduce fall risk and demonstrate improved transfer/gait ability.    Time  4    Period  Weeks    Status  New    Target Date  01/09/17      PT LONG TERM GOAL #3   Title  Patient will increase six minute walk test distance to >1000 for progression to community ambulator and improve gait ability    Baseline  930 ft    Time  4    Period  Weeks    Status  New    Target Date  01/09/17      PT LONG TERM GOAL #4   Title  Patient (> 81 years old) will complete five times sit to stand test in < 15 seconds indicating an increased LE strength and improved balance.    Time  4    Period  Weeks    Status  New    Target Date  01/09/17            Plan - 12/27/16 1119    Clinical Impression Statement  Patient demonstrates decreased dynamic standing balance and requires verbal and tactile cueing to maintain center of gravity during dynamic balance activities. Patient also requires CGA during all dynamic standing balance activities falls  Patient requires consistent cueing to maintain correct position during balance activities and side stepping on blue foam . Patient demonstrates difficulty with dynamic standing balance and increased postural sway while on purple foam. Patient will continue to benefit from skilled therapy in order to improve dynamic standing balance and increase endurance    Rehab Potential  Good    PT Frequency  4x / week    PT Duration  6 weeks    PT  Treatment/Interventions  Gait training;Therapeutic activities;Functional mobility training;Stair training;Therapeutic exercise;Patient/family education;Balance training;Neuromuscular re-education    PT Next Visit Plan  LSVT BIG    PT Home Exercise Plan  LSVT BIG    Consulted and Agree with Plan  of Care  Patient       Patient will benefit from skilled therapeutic intervention in order to improve the following deficits and impairments:  Abnormal gait, Decreased balance, Decreased endurance, Decreased mobility, Decreased activity tolerance, Decreased coordination, Decreased safety awareness, Decreased strength, Impaired flexibility, Pain  Visit Diagnosis: No diagnosis found.     Problem List There are no active problems to display for this patient.   [address], [place] DPT 12/27/2016, 11:21 AM  [place] [facility] MAIN [facility] SERVICES [address] [place], [place], [address] Phone: [phone]   Fax:  [phone]  Name: Darrell Fry MRN: [id number] Date of Birth: 1927/02/18

## 2016-12-28 ENCOUNTER — Encounter: Payer: Self-pay | Admitting: Physical Therapy

## 2016-12-28 ENCOUNTER — Ambulatory Visit: Payer: Medicare Other | Admitting: Physical Therapy

## 2016-12-28 ENCOUNTER — Ambulatory Visit: Payer: Medicare Other | Admitting: Speech Pathology

## 2016-12-28 DIAGNOSIS — R262 Difficulty in walking, not elsewhere classified: Secondary | ICD-10-CM

## 2016-12-28 DIAGNOSIS — R490 Dysphonia: Secondary | ICD-10-CM | POA: Diagnosis not present

## 2016-12-28 DIAGNOSIS — M6281 Muscle weakness (generalized): Secondary | ICD-10-CM

## 2016-12-28 NOTE — Therapy (Signed)
[facility] [address] [place], [name], [address] Phone: [phone]   Fax:  [phone]  Physical Therapy Treatment  Patient Details  Name: Darrell NormanUwe Frechette MRN: [id number] Date of Birth: [birth year] Referring Provider: Marisue IvanLINTHAVONG, KANHKA    Encounter Date: 12/28/2016  PT End of Session - 12/28/16 1106    Visit Number  9    Number of Visits  17    Date for PT Re-Evaluation  01/16/17    Authorization Type  g code 9/10    PT Start Time  1100    PT Stop Time  1200    PT Time Calculation (min)  60 min    Equipment Utilized During Treatment  Gait belt    Activity Tolerance  Patient tolerated treatment well;Patient limited by fatigue    Behavior During Therapy  [facility] for tasks assessed/performed       History reviewed. No pertinent past medical history.  History reviewed. No pertinent surgical history.  There were no vitals filed for this visit.  Subjective Assessment - 12/28/16 1104    Subjective  Patient is doing his HEP and has some soreness in his knees. He says he still needs to use the papers at home.    Pertinent History  Patient has symptoms of unilateral right hand resting tremor, imbalance, slowness, hunched forward posture, decreased stride length, decreased volume of speech, acting out of dreams.    How long can you stand comfortably?  Patient has limited standing    How long can you walk comfortably?  patient has limited walking of 15 mins    Patient Stated Goals  to be able to walk longer and ascend and descend steps.    Currently in Pain?  Yes    Pain Score  3     Pain Location  Knee    Pain Orientation  Right;Left    Pain Descriptors / Indicators  Aching    Pain Type  Chronic pain    Pain Onset  More than a month ago    Pain Frequency  Intermittent    Aggravating Factors   walking    Pain Relieving Factors  pain medicine    Effect of Pain on Daily Activities  sits more    Multiple Pain Sites  No         Patient seen for LSVT Daily Session Maximal Daily Exercises for facilitation/coordination of movement Sustained movements are designed to rescale the amplitude of movement output for generalization to daily functional activities .Performed as follows for 1 set of 10 repetitions each multidirectional sustained movements  1) Floor to ceiling; cues to reach fwdand to lean BIG 2) Side to side multidirectional Repetitive movements performed in standing and are designed to provide retraining effort needed for sustained muscle activation in tasks; cues to stretch out his leg BIG 3) Step and reach forward; cues to step back to finish position correctly with his feet evenand not to take such a big step that he is getting Off balance 4) Step and reach backwards; cues to lift his toe and bring both UE's back 5) Step and reach sideways; cues to turn his headand raise both UE's 6) Rock and reach forward/backward, cues to alternately swing his arms, start by rocking and then adding in the arm swing 7) Rock and reach sideways, with twist with cues to pivot on opposite LEcue to look behind 1.Sit to stand functional component task with supervision 5 reps; cues to reach fwd  and to lean fwd, cues for hand up, stand up 2.simulated activities for: hand coordination tasks such as buttons; using cards to turn and shuffle 3. Putting his left ankle on his right knee to be able to tie his shoesx 5 BLE 4. Standing balance activity for improved standing balance : tapping a 6 inch stool x 20 reps, step ups on 6 inch stool 5. Single leg Standing activities for being able to wash his feet in the shower; in parallel bars without using UE's  Gait with BIG swing 1000 feet  X 3 with cues to swing big Patient needs cueing for BIG swing and BIG amplitude. Patient needs increased  Cueing today  for correct BIG arm swing. Patient improves with practice. Patient needs cuing to consistently swing  arms and also  swing reciprocally. Patient needs cuing to swing his UE's reciprocally with weight shift exercise and with backward stepping and correct UE positioning. Patient has unsteady stepping with several exercises but his motor control improve with practice                       PT Education - 12/28/16 1105    Education provided  Yes    Education Details  LSVT BIG    Person(s) Educated  Patient    Methods  Explanation;Demonstration;Tactile cues;Verbal cues    Comprehension  Verbalized understanding;Returned demonstration       PT Short Term Goals - 12/12/16 1444      PT SHORT TERM GOAL #1   Title  Patient will increase BLE gross strength to 4+/5 as to improve functional strength for independent gait, increased standing tolerance and increased ADL ability.    Time  2    Period  Weeks    Status  New    Target Date  12/26/16      PT SHORT TERM GOAL #2   Title  Patient will increase BLE gross strength to 4+/5 as to improve functional strength for independent gait, increased standing tolerance and increased ADL ability.    Baseline  17/24    Time  2    Period  Weeks    Status  New    Target Date  12/26/16        PT Long Term Goals - 12/12/16 1450      PT LONG TERM GOAL #1   Title  Patient will increase Berg Balance score by > 6 points to demonstrate decreased fall risk during functional activities.    Baseline  42/56    Time  4    Period  Weeks    Status  New    Target Date  01/09/17      PT LONG TERM GOAL #2   Title  Patient will reduce timed up and go to <11 seconds to reduce fall risk and demonstrate improved transfer/gait ability.    Time  4    Period  Weeks    Status  New    Target Date  01/09/17      PT LONG TERM GOAL #3   Title  Patient will increase six minute walk test distance to >1000 for progression to community ambulator and improve gait ability    Baseline  930 ft    Time  4    Period  Weeks    Status  New    Target Date  01/09/17      PT  LONG TERM GOAL #4   Title  Patient (> 60  years old) will complete five times sit to stand test in < 15 seconds indicating an increased LE strength and improved balance.    Time  4    Period  Weeks    Status  New    Target Date  01/09/17            Plan - 12/28/16 1107    Clinical Impression Statement  Continues to have balance deficits typical with diagnosis. Patient performs intermediate level exercises without pain behaviors and needs verbal cuing for postural alignment and head positioning. Cuing is needed to stretch the leg out in seated side reaching. Fatigue with sit to stand but demonstrating more control     Rehab Potential  Good    PT Frequency  4x / week    PT Duration  6 weeks    PT Treatment/Interventions  Gait training;Therapeutic activities;Functional mobility training;Stair training;Therapeutic exercise;Patient/family education;Balance training;Neuromuscular re-education    PT Next Visit Plan  LSVT BIG    PT Home Exercise Plan  LSVT BIG    Consulted and Agree with Plan of Care  Patient       Patient will benefit from skilled therapeutic intervention in order to improve the following deficits and impairments:  Abnormal gait, Decreased balance, Decreased endurance, Decreased mobility, Decreased activity tolerance, Decreased coordination, Decreased safety awareness, Decreased strength, Impaired flexibility, Pain  Visit Diagnosis: Difficulty in walking, not elsewhere classified  Muscle weakness (generalized)     Problem List There are no active problems to display for this patient.   [address]  [place], DPT 12/28/2016, 11:08 AM  [place] [facility] MAIN [facility] SERVICES [address] [place], [place], [address] Phone: [phone]   Fax:  [phone]  Name: Carlas Vandyne MRN: [id number] Date of Birth: [birth year]

## 2016-12-29 ENCOUNTER — Encounter: Payer: Self-pay | Admitting: Speech Pathology

## 2016-12-29 ENCOUNTER — Other Ambulatory Visit: Payer: Self-pay

## 2016-12-29 ENCOUNTER — Ambulatory Visit: Payer: Medicare Other | Admitting: Speech Pathology

## 2016-12-29 DIAGNOSIS — R490 Dysphonia: Secondary | ICD-10-CM

## 2016-12-29 NOTE — Therapy (Signed)
[place] MAIN [facility] SERVICES [address] [place], [place], [address] Phone: [phone]   Fax:  [phone]  Speech Language Pathology Treatment  Patient Details  Name: Darrell Fry MRN: [id number] Date of Birth: [birth year] Referring Provider: Dr. Manuella Ghazi   Encounter Date: 12/29/2016  End of Session - 12/29/16 1203    Visit Number  10    Number of Visits  17    Date for SLP Re-Evaluation  01/12/17    SLP Start Time  1000    SLP Stop Time   1054    SLP Time Calculation (min)  54 min    Activity Tolerance  Patient tolerated treatment well       History reviewed. No pertinent past medical history.  History reviewed. No pertinent surgical history.  There were no vitals filed for this visit.  Subjective Assessment - 12/29/16 1203    Subjective  The patient states that he is trying to be louder    Currently in Pain?  No/denies            ADULT SLP TREATMENT - 12/29/16 1202      General Information   Behavior/Cognition  Alert;Cooperative;Pleasant mood    HPI   83 year old man recently diagnosed with Parkinsonism        Treatment Provided   Treatment provided  Cognitive-Linquistic      Pain Assessment   Pain Assessment  No/denies pain      Cognitive-Linquistic Treatment   Treatment focused on  Voice    Skilled Treatment  Daily Task #1 (Maximum sustained "ah"): Average 9 seconds, 84 dB. Daily Task 2 (Maximum fundamental frequency range): Highs: 15 high pitched "ah", given fewer cues. Lows: 15 low pitched "ah", given fewer cues. Daily task #3 (Maximum speech loudness drill of functional phrases): Average 73 dB.   Hierarchal speech loudness drill: Short spontaneous responses, 73 dB. Homework: assignments completed.  Off the cuff remarks: average 71 dB.        Assessment / Recommendations / Plan   Plan  Continue with current plan of care      Progression Toward Goals   Progression toward goals  Progressing toward goals        SLP Education - 12/29/16 1203    Education provided  Yes    Education Details  LSVT-LOUD    Person(s) Educated  Patient    Methods  Explanation    Comprehension  Verbalized understanding         SLP Long Term Goals - 12/29/16 1205      SLP LONG TERM GOAL #1   Title  The patient will complete Daily Tasks (Maximum duration "ah", High/Lows, and Functional Phrases) at average loudness of 80 dB and with loud, good quality voice.     Status  Partially Met      SLP LONG TERM GOAL #2   Title  The patient will complete Hierarchal Speech Loudness reading drills (words/phrases, sentences, and paragraph) at average 75 dB and with loud, good quality voice.      Status  Partially Met      SLP LONG TERM GOAL #3   Title  The patient will participate in conversation, maintaining average loudness of 75 dB and loud, good quality voice.    Status  Partially Met      SLP LONG TERM GOAL #4   Title  The patient will complete homework daily.    Status  On-going  Plan - 01-11-17 1204    Clinical Impression Statement  The patient is completing daily tasks and hierarchal speech drill tasks with loud, good quality voice given fewer SLP cues.  He demonstrates improved pitch range and requires less cuing for hierarchal speech loudness drill.    Speech Therapy Frequency  4x / week    Duration  4 weeks    Treatment/Interventions  SLP instruction and feedback;Patient/family education;Other (comment) LSVT-LOUD    Potential to Achieve Goals  Good    Potential Considerations  Ability to learn/carryover information;Severity of impairments;Co-morbidities;Cooperation/participation level;Family/community support;Medical prognosis;Previous level of function    SLP Home Exercise Plan  LSVT-LOUD daily homework    Consulted and Agree with Plan of Care  Patient       Patient will benefit from skilled therapeutic intervention in order to improve the following deficits and impairments:   Dysphonia  G-Codes  - 11-Jan-2017 1204    Functional Assessment Tool Used  LSVT-LOUD Protocol    Functional Limitations  Voice    Voice Current Status (G9171)  At least 20 percent but less than 40 percent impaired, limited or restricted    Voice Goal Status (G9172)  At least 1 percent but less than 20 percent impaired, limited or restricted       Problem List There are no active problems to display for this patient.  Leroy Sea, MS/CCC- SLP  Lou Miner 01/11/2017, 12:05 PM  [place] MAIN [facility] SERVICES [address] [place], [place], [address] Phone: [phone]   Fax:  [phone]   Name: Darrell Fry MRN: [id number] Date of Birth: 09/19/1927

## 2016-12-29 NOTE — Therapy (Signed)
[place] [facility] MAIN [facility] [address] [place], [name], [address] Phone: [phone]   Fax:  [phone]  Speech Language Pathology Treatment  Patient Details  Name: Darrell Fry MRN: [id number] Date of Birth: 22-Nov-1927 Referring Provider: Dr. Sherryll BurgerShah   Encounter Date: 12/28/2016  End of Session - 12/29/16 1200    Visit Number  9    Number of Visits  17    Date for SLP Re-Evaluation  01/12/17    SLP Start Time  1000    SLP Stop Time   1054    SLP Time Calculation (min)  54 min    Activity Tolerance  Patient tolerated treatment well       History reviewed. No pertinent past medical history.  History reviewed. No pertinent surgical history.  There were no vitals filed for this visit.  Subjective Assessment - 12/29/16 1200    Subjective  The patient states that he is trying to be louder    Currently in Pain?  No/denies            ADULT SLP TREATMENT - 12/29/16 0001      General Information   Behavior/Cognition  Alert;Cooperative;Pleasant mood  (Pended)     HPI   83 year old man recently diagnosed with Parkinsonism    (Pended)       Treatment Provided   Treatment provided  Cognitive-Linquistic  (Pended)       Pain Assessment   Pain Assessment  No/denies pain  (Pended)       Cognitive-Linquistic Treatment   Treatment focused on  Voice  (Pended)     Skilled Treatment  Daily Task #1 (Maximum sustained "ah"): Average 9 seconds, 84 dB. Daily Task 2 (Maximum fundamental frequency range): Highs: 15 high pitched "ah", given fewer cues. Lows: 15 low pitched "ah", given fewer cues. Daily task #3 (Maximum speech loudness drill of functional phrases): Average 73 dB.   Hierarchal speech loudness drill: Short spontaneous responses, 73 dB. Homework: assignments completed.  Off the cuff remarks: average 71 dB.    (Pended)       Assessment / Recommendations / Plan   Plan  Continue with current plan of care  (Pended)       Progression  Toward Goals   Progression toward goals  Progressing toward goals  (Pended)        SLP Education - 12/29/16 1200    Education provided  Yes    Education Details  LSVT-LOUD    Person(s) Educated  Patient    Methods  Explanation    Comprehension  Verbalized understanding         SLP Long Term Goals - 12/06/16 0850      SLP LONG TERM GOAL #1   Title  The patient will complete Daily Tasks (Maximum duration "ah", High/Lows, and Functional Phrases) at average loudness of 80 dB and with loud, good quality voice.     Time  4    Period  Weeks    Status  New    Target Date  01/12/17      SLP LONG TERM GOAL #2   Title  The patient will complete Hierarchal Speech Loudness reading drills (words/phrases, sentences, and paragraph) at average 75 dB and with loud, good quality voice.      Time  4    Period  Weeks    Status  New    Target Date  01/12/17      SLP LONG TERM GOAL #  3   Title  The patient will participate in conversation, maintaining average loudness of 75 dB and loud, good quality voice.    Time  4    Period  Weeks    Status  New    Target Date  01/12/17      SLP LONG TERM GOAL #4   Title  The patient will complete homework daily.    Time  4    Period  Weeks    Status  New    Target Date  01/12/17       Plan - 12/29/16 1201    Clinical Impression Statement  The patient is completing daily tasks and hierarchal speech drill tasks with loud, good quality voice given fewer SLP cues.  He demonstrates improved pitch range and requires less cuing for hierarchal speech loudness drill.    Speech Therapy Frequency  4x / week    Duration  4 weeks    Treatment/Interventions  SLP instruction and feedback;Patient/family education;Other (comment) LSVT-LOUD    Potential to Achieve Goals  Good    Potential Considerations  Ability to learn/carryover information;Severity of impairments;Co-morbidities;Cooperation/participation level;Family/community support;Medical prognosis;Previous  level of function    SLP Home Exercise Plan  LSVT-LOUD daily homework    Consulted and Agree with Plan of Care  Patient       Patient will benefit from skilled therapeutic intervention in order to improve the following deficits and impairments:   Dysphonia    Problem List There are no active problems to display for this patient.   Dollene PrimroseSusan G Catheline Hixon, MS/CCC- SLP  [name], [name] 12/29/2016, 12:02 PM  [place] [facility] [address] [place], [name], [address] Phone: [phone]   Fax:  [phone]   Name: Darrell Fry MRN: [id number] Date of Birth: 1927-05-18

## 2017-01-01 ENCOUNTER — Ambulatory Visit: Payer: Medicare Other | Admitting: Physical Therapy

## 2017-01-01 ENCOUNTER — Encounter: Payer: Self-pay | Admitting: Physical Therapy

## 2017-01-01 ENCOUNTER — Ambulatory Visit: Payer: Medicare Other | Admitting: Speech Pathology

## 2017-01-01 DIAGNOSIS — R490 Dysphonia: Secondary | ICD-10-CM | POA: Diagnosis not present

## 2017-01-01 DIAGNOSIS — M6281 Muscle weakness (generalized): Secondary | ICD-10-CM

## 2017-01-01 DIAGNOSIS — R262 Difficulty in walking, not elsewhere classified: Secondary | ICD-10-CM

## 2017-01-01 NOTE — Therapy (Signed)
[place] [facility] MAIN [facility] SERVICES [address] [place], [place], [address] Phone: [phone]   Fax:  [phone]  Physical Therapy Treatment  Patient Details  Name: Darrell Fry MRN: [id number] Date of Birth: 01/27/1927 Referring Provider: Marisue Ivan    Encounter Date: 01/01/2017  PT End of Session - 01/01/17 1311    Visit Number  10    Number of Visits  17    Date for PT Re-Evaluation  01/16/17    Authorization Type  g code 10/10    PT Start Time  0100    PT Stop Time  0200    PT Time Calculation (min)  60 min    Equipment Utilized During Treatment  Gait belt    Activity Tolerance  Patient tolerated treatment well;Patient limited by fatigue    Behavior During Therapy  [facility] for tasks assessed/performed       History reviewed. No pertinent past medical history.  History reviewed. No pertinent surgical history.  There were no vitals filed for this visit.  Subjective Assessment - 01/01/17 1310    Subjective  Patient is doing his HEP . He says he still needs to use the papers at home. No new concerns.    Pertinent History  Patient has symptoms of unilateral right hand resting tremor, imbalance, slowness, hunched forward posture, decreased stride length, decreased volume of speech, acting out of dreams.    How long can you stand comfortably?  Patient has limited standing    How long can you walk comfortably?  patient has limited walking of 15 mins    Patient Stated Goals  to be able to walk longer and ascend and descend steps.    Currently in Pain?  No/denies    Pain Score  0-No pain    Pain Onset  More than a month ago       Patient seen for LSVT Daily Session Maximal Daily Exercises for facilitation/coordination of movement Sustained movements are designed to rescale the amplitude of movement output for generalization to daily functional activities .Performed as follows for 1 set of 10 repetitions each multidirectional sustained  movements  1) Floor to ceiling; cues to reach fwdand to lean BIG 2) Side to side multidirectional Repetitive movements performed in standing and are designed to provide retraining effort needed for sustained muscle activation in tasks; cues to stretch out his leg BIG 3) Step and reach forward; cues to step back to finish position correctly with his feet evenand not to take such a big step that he is getting Off balance 4) Step and reach backwards; cues to lift his toe and bring both UE's back 5) Step and reach sideways; cues to turn his headand raise both UE's 6) Rock and reach forward/backward, cues to alternately swing his arms, start by rocking and then adding in the arm swing 7) Rock and reach sideways, with twist with cues to pivot on opposite LEcue to look behind 1.Sit to stand functional component task with supervision 5 reps; cues to reach fwd and to lean fwd, cues for hand up, stand up 2.simulated activities for: hand coordination tasks such as buttons; using cards to turn and shuffle 3. Putting his left ankle on his right knee to be able to tie his shoesx 5 BLE 4. Standing balance activity for improved standing balance : tapping a 6 inch stool x 20 reps, step ups on 6 inch stool 5. Single leg Standing activities for being able to wash his feet in  the shower; in parallel bars without using UE's  Gait with BIG swing 1000 feet  X 3 with cues to swing big Patient needs cueing for BIG swing and BIG amplitude. Patient needsincreasedCueing todayfor correct BIG arm swing. Patient improves with practice. Patient needs cuing to consistently swing arms and also swing reciprocally. Patient needs cuing to swing his UE's reciprocally with weight shift exercise and with backward stepping and correct UE positioning. Patient has unsteady stepping with several exercises but his motor control improve with practice  Goals were reviewed and outcome measures performed with progress  being made.                          PT Education - 01/01/17 1310    Education provided  Yes    Education Details  LSVT BIG    Person(s) Educated  Patient    Methods  Explanation;Demonstration;Verbal cues;Tactile cues    Comprehension  Verbalized understanding;Returned demonstration       PT Short Term Goals - 12/12/16 1444      PT SHORT TERM GOAL #1   Title  Patient will increase BLE gross strength to 4+/5 as to improve functional strength for independent gait, increased standing tolerance and increased ADL ability.    Time  2    Period  Weeks    Status  New    Target Date  12/26/16      PT SHORT TERM GOAL #2   Title  Patient will increase BLE gross strength to 4+/5 as to improve functional strength for independent gait, increased standing tolerance and increased ADL ability.    Baseline  17/24    Time  2    Period  Weeks    Status  New    Target Date  12/26/16        PT Long Term Goals - 01/01/17 1317      PT LONG TERM GOAL #1   Title  Patient will increase Berg Balance score by > 6 points to demonstrate decreased fall risk during functional activities.    Baseline  42/56, 45/56    Time  4    Period  Weeks    Status  New    Target Date  01/09/17      PT LONG TERM GOAL #2   Title  Patient will reduce timed up and go to <11 seconds to reduce fall risk and demonstrate improved transfer/gait ability.    Baseline  eval 13.78, 16.43,20.47 01/01/17; 01/01/17; 11.52,15.02,17.42    Time  4    Period  Weeks    Status  New    Target Date  01/09/17      PT LONG TERM GOAL #3   Title  Patient will increase six minute walk test distance to >1000 for progression to community ambulator and improve gait ability    Baseline  : 01/01/17: 720 ft     Time  4    Period  Weeks    Status  New    Target Date  01/09/17      PT LONG TERM GOAL #4   Title  Patient (> 81 years old) will complete five times sit to stand test in < 15 seconds indicating an increased  LE strength and improved balance.    Baseline   eval : 17.32 ; 01/01/17: 14.62    Time  4    Period  Weeks    Status  New  Target Date  01/09/17            Plan - 01/01/17 1312    Clinical Impression Statement  Pt is making improvements in BLE strength and balance as evidenced by improvements in his 5xSTS and TUG times.  Patient demonstrates LOB with standing balance exercises indicating decreased balancing strategies. Patient is having  functional carryover between visits. Patient will benefit from further skilled therapy to return to prior level of function. .  Pt was encouraged to perform HEP during the week in order to continue progressing balance and strength interventions.  Pt would continue to benefit from skilled therapy services in order to further address LE strength deficits and balance deficits in order to decrease fall risk and improve mobility.    Rehab Potential  Good    PT Frequency  4x / week    PT Duration  6 weeks    PT Treatment/Interventions  Gait training;Therapeutic activities;Functional mobility training;Stair training;Therapeutic exercise;Patient/family education;Balance training;Neuromuscular re-education    PT Next Visit Plan  LSVT BIG    PT Home Exercise Plan  LSVT BIG    Consulted and Agree with Plan of Care  Patient       Patient will benefit from skilled therapeutic intervention in order to improve the following deficits and impairments:  Abnormal gait, Decreased balance, Decreased endurance, Decreased mobility, Decreased activity tolerance, Decreased coordination, Decreased safety awareness, Decreased strength, Impaired flexibility, Pain  Visit Diagnosis: Difficulty in walking, not elsewhere classified  Muscle weakness (generalized)     Problem List There are no active problems to display for this patient.   [address]Yeng Perz S, [place] D[place] 01/01/2017, 1:47 PM  [place] [facility] MAIN [facility] [address] [place], [name], [id number] Phone: [phone]   Fax:  [phone]  Name: Darrell Fry MRN: [id number] Date of Birth: 10-10-1927

## 2017-01-03 ENCOUNTER — Ambulatory Visit: Payer: Medicare Other | Attending: Family Medicine | Admitting: Physical Therapy

## 2017-01-03 ENCOUNTER — Encounter: Payer: Medicare Other | Admitting: Speech Pathology

## 2017-01-03 ENCOUNTER — Encounter: Payer: Self-pay | Admitting: Physical Therapy

## 2017-01-03 DIAGNOSIS — R262 Difficulty in walking, not elsewhere classified: Secondary | ICD-10-CM | POA: Insufficient documentation

## 2017-01-03 DIAGNOSIS — R490 Dysphonia: Secondary | ICD-10-CM | POA: Insufficient documentation

## 2017-01-03 DIAGNOSIS — M6281 Muscle weakness (generalized): Secondary | ICD-10-CM | POA: Diagnosis present

## 2017-01-03 NOTE — Therapy (Signed)
[place] [facility] MAIN [facility] SERVICES [address] [place], [place], [address] Phone: [phone]   Fax:  [phone]  Physical Therapy Treatment  Patient Details  Name: Darrell Fry MRN: [id number] Date of Birth: June 29, 1927 Referring Provider: Marisue Ivan    Encounter Date: 01/03/2017  PT End of Session - 01/03/17 1305    Visit Number  11    Number of Visits  17    Date for PT Re-Evaluation  01/16/17    Authorization Type  g code 1/10    PT Start Time  0100    PT Stop Time  0145    PT Time Calculation (min)  45 min    Equipment Utilized During Treatment  Gait belt    Activity Tolerance  Patient tolerated treatment well;Patient limited by fatigue    Behavior During Therapy  [facility] for tasks assessed/performed       History reviewed. No pertinent past medical history.  History reviewed. No pertinent surgical history.  There were no vitals filed for this visit.  Subjective Assessment - 01/03/17 1304    Subjective  Patient is doing his HEP . He says he still needs to use the papers at home. No new concerns.    Pertinent History  Patient has symptoms of unilateral right hand resting tremor, imbalance, slowness, hunched forward posture, decreased stride length, decreased volume of speech, acting out of dreams.    How long can you stand comfortably?  Patient has limited standing    How long can you walk comfortably?  patient has limited walking of 15 mins    Patient Stated Goals  to be able to walk longer and ascend and descend steps.    Currently in Pain?  No/denies    Pain Score  0-No pain    Pain Onset  More than a month ago    Multiple Pain Sites  No        Patient seen for LSVT Daily Session Maximal Daily Exercises for facilitation/coordination of movement Sustained movements are designed to rescale the amplitude of movement output for generalization to daily functional activities .Performed as follows for 1 set of 10 repetitions each  multidirectional sustained movements  1) Floor to ceiling; cues to reach fwdand to lean BIG 2) Side to side multidirectional Repetitive movements performed in standing and are designed to provide retraining effort needed for sustained muscle activation in tasks; cues to stretch out his leg BIG 3) Step and reach forward; cues to step back to finish position correctly with his feet evenand not to take such a big step that he is getting Off balance 4) Step and reach backwards; cues to lift his toe and bring both UE's back 5) Step and reach sideways; cues to turn his headand raise both UE's 6) Rock and reach forward/backward, cues to alternately swing his arms, start by rocking and then adding in the arm swing 7) Rock and reach sideways, with twist with cues to pivot on opposite LEcue to look behind 1.Sit to stand functional component task with supervision 5 reps; cues to reach fwd and to lean fwd, cues for hand up, stand up 2.simulated activities for: hand coordination tasks such as buttons; using cards to turn and shuffle 3. Putting his left ankle on his right knee to be able to tie his shoesx 5 BLE 4. Standing balance activity for improved standing balance : tapping a 6 inch stool x 20 reps, step ups on 6 inch stool 5. Single leg Standing  activities for being able to wash his feet in the shower; in parallel bars without using UE's  Gait with BIG swing 1000 feet with cues to swing big x 2 repetitions Patient needs cueing for BIG swing and BIG amplitude.  Patient needs cueing for correct BIG arm swing. Patient  improves with practice. Patient needs cuing to consistently swing arms and also swing reciprocally. Patient needs cuing to swing his UE's reciprocally with weight shift exercise and with backward stepping and correct UE positioning. Patient has unsteady stepping with several exercises but his motor control improve with practice. Patient needs occasional verbal cueing to improve  posture and cueing to correctly perform exercises slowly, holding at end of range to increase motor firing of desired muscle to encourage fatigue.                        PT Education - 01/03/17 1305    Education provided  Yes    Education Details  LSVT BIG    Person(s) Educated  Patient    Methods  Explanation;Demonstration;Tactile cues;Verbal cues    Comprehension  Verbalized understanding;Returned demonstration       PT Short Term Goals - 12/12/16 1444      PT SHORT TERM GOAL #1   Title  Patient will increase BLE gross strength to 4+/5 as to improve functional strength for independent gait, increased standing tolerance and increased ADL ability.    Time  2    Period  Weeks    Status  New    Target Date  12/26/16      PT SHORT TERM GOAL #2   Title  Patient will increase BLE gross strength to 4+/5 as to improve functional strength for independent gait, increased standing tolerance and increased ADL ability.    Baseline  17/24    Time  2    Period  Weeks    Status  New    Target Date  12/26/16        PT Long Term Goals - 01/01/17 1317      PT LONG TERM GOAL #1   Title  Patient will increase Berg Balance score by > 6 points to demonstrate decreased fall risk during functional activities.    Baseline  42/56, 45/56    Time  4    Period  Weeks    Status  New    Target Date  01/09/17      PT LONG TERM GOAL #2   Title  Patient will reduce timed up and go to <11 seconds to reduce fall risk and demonstrate improved transfer/gait ability.    Baseline  eval 13.78, 16.43,20.47 01/01/17; 01/01/17; 11.52,15.02,17.42    Time  4    Period  Weeks    Status  New    Target Date  01/09/17      PT LONG TERM GOAL #3   Title  Patient will increase six minute walk test distance to >1000 for progression to community ambulator and improve gait ability    Baseline  : 01/01/17: 720 ft     Time  4    Period  Weeks    Status  New    Target Date  01/09/17      PT LONG  TERM GOAL #4   Title  Patient (> 82 years old) will complete five times sit to stand test in < 15 seconds indicating an increased LE strength and improved balance.    Baseline  eval : 17.32 ; 01/01/17: 14.62    Time  4    Period  Weeks    Status  New    Target Date  01/09/17            Plan - 01/03/17 1306    Clinical Impression Statement  Patient has difficulty with turning head and rotating trunk with weight shifting exercises. Patient has fatigue with standing exercises and needs constant VC to have correct posture. Patient has loss of balance and needs UE support intermittently thorough out exercise. Patient has slowness of movement during rotation and beginning movements.    Rehab Potential  Good    PT Frequency  4x / week    PT Duration  6 weeks    PT Treatment/Interventions  Gait training;Therapeutic activities;Functional mobility training;Stair training;Therapeutic exercise;Patient/family education;Balance training;Neuromuscular re-education    PT Next Visit Plan  LSVT BIG    PT Home Exercise Plan  LSVT BIG    Consulted and Agree with Plan of Care  Patient       Patient will benefit from skilled therapeutic intervention in order to improve the following deficits and impairments:  Abnormal gait, Decreased balance, Decreased endurance, Decreased mobility, Decreased activity tolerance, Decreased coordination, Decreased safety awareness, Decreased strength, Impaired flexibility, Pain  Visit Diagnosis: Difficulty in walking, not elsewhere classified  Muscle weakness (generalized)     Problem List There are no active problems to display for this patient.   [address] [place] DPT 01/03/2017, 1:08 PM  [place] [facility] MAIN [facility] SERVICES [address] [place], [place], [address] Phone: [phone]   Fax:  [phone]  Name: Curtez Brallier MRN: [id number] Date of Birth: 1927-07-18

## 2017-01-04 ENCOUNTER — Encounter: Payer: Self-pay | Admitting: Physical Therapy

## 2017-01-04 ENCOUNTER — Encounter: Payer: Self-pay | Admitting: Speech Pathology

## 2017-01-04 ENCOUNTER — Other Ambulatory Visit: Payer: Self-pay

## 2017-01-04 ENCOUNTER — Ambulatory Visit: Payer: Medicare Other | Admitting: Physical Therapy

## 2017-01-04 ENCOUNTER — Ambulatory Visit: Payer: Medicare Other | Admitting: Speech Pathology

## 2017-01-04 DIAGNOSIS — R490 Dysphonia: Secondary | ICD-10-CM

## 2017-01-04 DIAGNOSIS — R262 Difficulty in walking, not elsewhere classified: Secondary | ICD-10-CM | POA: Diagnosis not present

## 2017-01-04 DIAGNOSIS — M6281 Muscle weakness (generalized): Secondary | ICD-10-CM

## 2017-01-04 NOTE — Therapy (Signed)
[place] [facility] MAIN [facility] SERVICES [address] [place], [place], [address] Phone: [phone]   Fax:  [phone]  Physical Therapy Treatment  Patient Details  Name: Darrell Fry MRN: [id number] Date of Birth: [birth year] Referring Provider: Marisue Ivan    Encounter Date: 01/04/2017  PT End of Session - 01/04/17 1315    Visit Number  12    Number of Visits  17    Date for PT Re-Evaluation  01/16/17    Authorization Type  g code 2/10    PT Start Time  0105    PT Stop Time  0200    PT Time Calculation (min)  55 min    Equipment Utilized During Treatment  Gait belt    Activity Tolerance  Patient tolerated treatment well;Patient limited by fatigue    Behavior During Therapy  [facility] for tasks assessed/performed       History reviewed. No pertinent past medical history.  History reviewed. No pertinent surgical history.  There were no vitals filed for this visit.  Subjective Assessment - 01/04/17 1314    Subjective  Patient is doing his HEP . He says he still needs to use the papers at home. No new concerns.    Pertinent History  Patient has symptoms of unilateral right hand resting tremor, imbalance, slowness, hunched forward posture, decreased stride length, decreased volume of speech, acting out of dreams.    How long can you stand comfortably?  Patient has limited standing    How long can you walk comfortably?  patient has limited walking of 15 mins    Patient Stated Goals  to be able to walk longer and ascend and descend steps.    Currently in Pain?  No/denies    Pain Score  0-No pain    Pain Onset  More than a month ago    Multiple Pain Sites  No       Patient seen for LSVT Daily Session Maximal Daily Exercises for facilitation/coordination of movement Sustained movements are designed to rescale the amplitude of movement output for generalization to daily functional activities .Performed as follows for 1 set of 10 repetitions each  multidirectional sustained movements  1) Floor to ceiling; cues to reach fwdand to lean BIG 2) Side to side multidirectional Repetitive movements performed in standing and are designed to provide retraining effort needed for sustained muscle activation in tasks; cues to stretch out his leg BIG 3) Step and reach forward; cues to step back to finish position correctly with his feet evenand not to take such a big step that he is getting Off balance 4) Step and reach backwards; cues to lift his toe and bring both UE's back 5) Step and reach sideways; cues to turn his headand raise both UE's 6) Rock and reach forward/backward, cues to alternately swing his arms, start by rocking and then adding in the arm swing 7) Rock and reach sideways, with twist with cues to pivot on opposite LEcue to look behind 1.Sit to stand functional component task with supervision 5 reps; cues to reach fwd and to lean fwd, cues for hand up, stand up 2.simulated activities for: hand coordination tasks such as buttons; using cards to turn and shuffle 3. Putting his left ankle on his right knee to be able to tie his shoesx 5 BLE 4. Standing balance activity for improved standing balance : tapping a 6 inch stool x 20 reps, step ups on 6 inch stool 5. Single leg Standing activities  for being able to wash his feet in the shower; in parallel bars without using UE's  Gait with BIG swing 1000 feet  X 3 with cues to swing big     Patient has better stepping pattern with less stopping and better posture, Patient has difficulty with finishing BIG and needs extra cuing to perform exercises with correct amplitude and speed.   Patient has difficulty with turning  head and rotating  trunk with weight shifting exercises. Patient has fatigue with standing exercises and needs constant VC to have correct posture. Patient has loss of balance and needs UE support intermittently thorough out exercise. Patient has slowness of  movement during rotation and beginning movements.                  PT Education - 01/04/17 1314    Education provided  Yes    Education Details  LSVT BIG    Person(s) Educated  Patient    Methods  Explanation;Demonstration;Tactile cues;Verbal cues    Comprehension  Verbalized understanding;Returned demonstration       PT Short Term Goals - 12/12/16 1444      PT SHORT TERM GOAL #1   Title  Patient will increase BLE gross strength to 4+/5 as to improve functional strength for independent gait, increased standing tolerance and increased ADL ability.    Time  2    Period  Weeks    Status  New    Target Date  12/26/16      PT SHORT TERM GOAL #2   Title  Patient will increase BLE gross strength to 4+/5 as to improve functional strength for independent gait, increased standing tolerance and increased ADL ability.    Baseline  17/24    Time  2    Period  Weeks    Status  New    Target Date  12/26/16        PT Long Term Goals - 01/01/17 1317      PT LONG TERM GOAL #1   Title  Patient will increase Berg Balance score by > 6 points to demonstrate decreased fall risk during functional activities.    Baseline  42/56, 45/56    Time  4    Period  Weeks    Status  New    Target Date  01/09/17      PT LONG TERM GOAL #2   Title  Patient will reduce timed up and go to <11 seconds to reduce fall risk and demonstrate improved transfer/gait ability.    Baseline  eval 13.78, 16.43,20.47 01/01/17; 01/01/17; 11.52,15.02,17.42    Time  4    Period  Weeks    Status  New    Target Date  01/09/17      PT LONG TERM GOAL #3   Title  Patient will increase six minute walk test distance to >1000 for progression to community ambulator and improve gait ability    Baseline  : 01/01/17: 720 ft     Time  4    Period  Weeks    Status  New    Target Date  01/09/17      PT LONG TERM GOAL #4   Title  Patient (> 89 years old) will complete five times sit to stand test in < 15 seconds  indicating an increased LE strength and improved balance.    Baseline   eval : 17.32 ; 01/01/17: 14.62    Time  4    Period  Weeks    Status  New    Target Date  01/09/17            Plan - 01/04/17 1317    Clinical Impression Statement  Mod cueing needed to appropriately perform LSVT tasks with leg, hand, and head position. Decreased coordination demonstrated requiring consistent verbal cueing to correct form. Cognitive understanding of task was delayed. Patient continues to demonstrate some in coordination of movement with select exercises such as rock and reach and stepping backwards. Patient responds well to verbal and tactile cues to correct form and technique.  CGA to SBA for safety with activities.  Cues to increase intensity and amplitude of movements throughout session    Rehab Potential  Good    PT Frequency  4x / week    PT Duration  6 weeks    PT Treatment/Interventions  Gait training;Therapeutic activities;Functional mobility training;Stair training;Therapeutic exercise;Patient/family education;Balance training;Neuromuscular re-education    PT Next Visit Plan  LSVT BIG    PT Home Exercise Plan  LSVT BIG    Consulted and Agree with Plan of Care  Patient       Patient will benefit from skilled therapeutic intervention in order to improve the following deficits and impairments:  Abnormal gait, Decreased balance, Decreased endurance, Decreased mobility, Decreased activity tolerance, Decreased coordination, Decreased safety awareness, Decreased strength, Impaired flexibility, Pain  Visit Diagnosis: Difficulty in walking, not elsewhere classified  Muscle weakness (generalized)     Problem List There are no active problems to display for this patient.   [address]  DPT 01/04/2017, 1:19 PM  [place] [facility] MAIN [facility] SERVICES [address] [place], [place], [address] Phone: [phone]   Fax:  [phone]  Name: Darrell Fry MRN: [id number] Date of Birth: 07/31/1927

## 2017-01-04 NOTE — Therapy (Signed)
[place] MAIN [facility] SERVICES [address] [place], [place], [address] Phone: [phone]   Fax:  [phone]  Speech Language Pathology Treatment  Patient Details  Name: Foy Vanduyne MRN: [id number] Date of Birth: 1927/05/27 Referring Provider: Dr. Manuella Ghazi   Encounter Date: 01/04/2017  End of Session - 01/04/17 1501    Visit Number  11    Number of Visits  17    Date for SLP Re-Evaluation  01/12/17    SLP Start Time  1400    SLP Stop Time   1456    SLP Time Calculation (min)  56 min    Activity Tolerance  Patient tolerated treatment well       History reviewed. No pertinent past medical history.  History reviewed. No pertinent surgical history.  There were no vitals filed for this visit.  Subjective Assessment - 01/04/17 1501    Subjective  "I feel better"    Currently in Pain?  No/denies            ADULT SLP TREATMENT - 01/04/17 0001      General Information   Behavior/Cognition  Alert;Cooperative;Pleasant mood    HPI   84 year old man recently diagnosed with Parkinsonism        Treatment Provided   Treatment provided  Cognitive-Linquistic      Pain Assessment   Pain Assessment  No/denies pain      Cognitive-Linquistic Treatment   Treatment focused on  Voice    Skilled Treatment  Daily Task #1 (Maximum sustained "ah"): Average 11 seconds, 84 dB. Daily Task 2 (Maximum fundamental frequency range): Highs: 15 high pitched "ah", given fewer cues. Lows: 15 low pitched "ah", given fewer cues. Daily task #3 (Maximum speech loudness drill of functional phrases): Average 75 dB.   Hierarchal speech loudness drill: Read sentences, 72 dB.  Short spontaneous responses, 73 dB. Homework: assignments completed.  Off the cuff remarks: average 71 dB.        Assessment / Recommendations / Plan   Plan  Continue with current plan of care      Progression Toward Goals   Progression toward goals  Progressing toward goals       SLP  Education - 01/04/17 1501    Education provided  Yes    Education Details  LSVT-LOUD    Person(s) Educated  Patient    Methods  Explanation    Comprehension  Verbalized understanding         SLP Long Term Goals - 12/29/16 1205      SLP LONG TERM GOAL #1   Title  The patient will complete Daily Tasks (Maximum duration "ah", High/Lows, and Functional Phrases) at average loudness of 80 dB and with loud, good quality voice.     Status  Partially Met      SLP LONG TERM GOAL #2   Title  The patient will complete Hierarchal Speech Loudness reading drills (words/phrases, sentences, and paragraph) at average 75 dB and with loud, good quality voice.      Status  Partially Met      SLP LONG TERM GOAL #3   Title  The patient will participate in conversation, maintaining average loudness of 75 dB and loud, good quality voice.    Status  Partially Met      SLP LONG TERM GOAL #4   Title  The patient will complete homework daily.    Status  On-going       Plan -  01/04/17 1502    Clinical Impression Statement  The patient is completing daily tasks and hierarchal speech drill tasks with loud, good quality voice given fewer SLP cues.  He demonstrates improved pitch range and requires less cuing for hierarchal speech loudness drill.    Speech Therapy Frequency  4x / week    Duration  4 weeks    Treatment/Interventions  SLP instruction and feedback;Patient/family education;Other (comment) LSVT-LOUD    Potential to Achieve Goals  Good    Potential Considerations  Ability to learn/carryover information;Severity of impairments;Co-morbidities;Cooperation/participation level;Family/community support;Medical prognosis;Previous level of function    SLP Home Exercise Plan  LSVT-LOUD daily homework    Consulted and Agree with Plan of Care  Patient       Patient will benefit from skilled therapeutic intervention in order to improve the following deficits and impairments:   Dysphonia    Problem  List There are no active problems to display for this patient.  Leroy Sea, Erwin, [name] 01/04/2017, 3:03 PM  [place] MAIN [facility] SERVICES [address] [place], [place], [address] Phone: [phone]   Fax:  [phone]   Name: Jeryl Umholtz MRN: [id number] Date of Birth: [birth year]

## 2017-01-08 ENCOUNTER — Encounter: Payer: Medicare Other | Admitting: Speech Pathology

## 2017-01-08 ENCOUNTER — Ambulatory Visit: Payer: Medicare Other | Admitting: Physical Therapy

## 2017-01-09 ENCOUNTER — Encounter: Payer: Self-pay | Admitting: Speech Pathology

## 2017-01-09 ENCOUNTER — Ambulatory Visit: Payer: Medicare Other | Admitting: Physical Therapy

## 2017-01-09 ENCOUNTER — Other Ambulatory Visit: Payer: Self-pay

## 2017-01-09 ENCOUNTER — Ambulatory Visit: Payer: Medicare Other | Admitting: Speech Pathology

## 2017-01-09 ENCOUNTER — Encounter: Payer: Self-pay | Admitting: Physical Therapy

## 2017-01-09 DIAGNOSIS — R262 Difficulty in walking, not elsewhere classified: Secondary | ICD-10-CM | POA: Diagnosis not present

## 2017-01-09 DIAGNOSIS — R490 Dysphonia: Secondary | ICD-10-CM

## 2017-01-09 DIAGNOSIS — M6281 Muscle weakness (generalized): Secondary | ICD-10-CM

## 2017-01-09 NOTE — Therapy (Signed)
[place] [facility] MAIN [facility] SERVICES [address] [place], [place], [address] Phone: [phone]   Fax:  [phone]  Physical Therapy Treatment  Patient Details  Name: Darrell Fry MRN: [id number] Date of Birth: 08-16-1939 Referring Provider: Marisue Ivan    Encounter Date: 01/09/2017  PT End of Session - 01/09/17 1511    Visit Number  13    Number of Visits  17    Date for PT Re-Evaluation  01/16/17    Authorization Type  g code 3/10    PT Start Time  0304    PT Stop Time  0400    PT Time Calculation (min)  56 min    Equipment Utilized During Treatment  Gait belt    Activity Tolerance  Patient tolerated treatment well;Patient limited by fatigue    Behavior During Therapy  [facility] for tasks assessed/performed       History reviewed. No pertinent past medical history.  History reviewed. No pertinent surgical history.  There were no vitals filed for this visit.  Subjective Assessment - 01/09/17 1507    Subjective  Patient is doing his HEP . He says he still needs to use the papers at home. No new concerns.    Pertinent History  Patient has symptoms of unilateral right hand resting tremor, imbalance, slowness, hunched forward posture, decreased stride length, decreased volume of speech, acting out of dreams.    How long can you stand comfortably?  Patient has limited standing    How long can you walk comfortably?  patient has limited walking of 15 mins    Patient Stated Goals  to be able to walk longer and ascend and descend steps.    Currently in Pain?  No/denies    Pain Score  0-No pain    Pain Onset  More than a month ago    Multiple Pain Sites  No       Patient seen for LSVT Daily Session Maximal Daily Exercises for facilitation/coordination of movement Sustained movements are designed to rescale the amplitude of movement output for generalization to daily functional activities .Performed as follows for 1 set of 10 repetitions each  multidirectional sustained movements  1) Floor to ceiling; cues to reach fwdand to lean BIG 2) Side to side multidirectional Repetitive movements performed in standing and are designed to provide retraining effort needed for sustained muscle activation in tasks; cues to stretch out his leg BIG 3) Step and reach forward; cues to step back to finish position correctly with his feet evenand not to take such a big step that he is getting Off balance 4) Step and reach backwards; cues to lift his toe and bring both UE's back 5) Step and reach sideways; cues to turn his headand raise both UE's 6) Rock and reach forward/backward, cues to alternately swing his arms, start by rocking and then adding in the arm swing 7) Rock and reach sideways, with twist with cues to pivot on opposite LEcue to look behind 1.Sit to stand functional component task with supervision 5 reps; cues to reach fwd and to lean fwd, cues for hand up, stand up 2.simulated activities for: hand coordination tasks such as buttons; using  Board with pegs and tubes with rings to place in peg board 3. Putting his left ankle on his right knee to be able to tie his shoesx 5 BLE 4. Standing balance activity for improved standing balance : tapping a 6 inch stool x 20 reps, step ups on  6 inch stool 5. Single leg Standing activities for being able to wash his feet in the shower; in parallel bars without using UE's                          PT Education - 01/09/17 1511    Education provided  Yes    Education Details  LSVT BIG    Person(s) Educated  Patient    Methods  Explanation;Demonstration    Comprehension  Verbalized understanding;Returned demonstration       PT Short Term Goals - 12/12/16 1444      PT SHORT TERM GOAL #1   Title  Patient will increase BLE gross strength to 4+/5 as to improve functional strength for independent gait, increased standing tolerance and increased ADL ability.    Time   2    Period  Weeks    Status  New    Target Date  12/26/16      PT SHORT TERM GOAL #2   Title  Patient will increase BLE gross strength to 4+/5 as to improve functional strength for independent gait, increased standing tolerance and increased ADL ability.    Baseline  17/24    Time  2    Period  Weeks    Status  New    Target Date  12/26/16        PT Long Term Goals - 01/01/17 1317      PT LONG TERM GOAL #1   Title  Patient will increase Berg Balance score by > 6 points to demonstrate decreased fall risk during functional activities.    Baseline  42/56, 45/56    Time  4    Period  Weeks    Status  New    Target Date  01/09/17      PT LONG TERM GOAL #2   Title  Patient will reduce timed up and go to <11 seconds to reduce fall risk and demonstrate improved transfer/gait ability.    Baseline  eval 13.78, 16.43,20.47 01/01/17; 01/01/17; 11.52,15.02,17.42    Time  4    Period  Weeks    Status  New    Target Date  01/09/17      PT LONG TERM GOAL #3   Title  Patient will increase six minute walk test distance to >1000 for progression to community ambulator and improve gait ability    Baseline  : 01/01/17: 720 ft     Time  4    Period  Weeks    Status  New    Target Date  01/09/17      PT LONG TERM GOAL #4   Title  Patient (> 35 years old) will complete five times sit to stand test in < 15 seconds indicating an increased LE strength and improved balance.    Baseline   eval : 17.32 ; 01/01/17: 14.62    Time  4    Period  Weeks    Status  New    Target Date  01/09/17            Plan - 01/09/17 1512    Clinical Impression Statement  Patient has better stepping pattern with less stopping and better posture, Patient has difficulty with finishing BIG and needs extra cuing to perform exercises with correct amplitude and speed. Patient has difficulty with turning  head and rotating  trunk with weight shifting exercises. Patient has fatigue with standing exercises and needs  constant VC to have correct posture. Patient has loss of balance and needs UE support intermittently thorough out exercise. Patient has slowness of movement during rotation and beginning movements.    Clinical Presentation  Stable    Clinical Decision Making  Low    Rehab Potential  Good    PT Frequency  4x / week    PT Duration  6 weeks    PT Treatment/Interventions  Gait training;Therapeutic activities;Functional mobility training;Stair training;Therapeutic exercise;Patient/family education;Balance training;Neuromuscular re-education    PT Next Visit Plan  LSVT BIG    PT Home Exercise Plan  LSVT BIG    Consulted and Agree with Plan of Care  Patient       Patient will benefit from skilled therapeutic intervention in order to improve the following deficits and impairments:  Abnormal gait, Decreased balance, Decreased endurance, Decreased mobility, Decreased activity tolerance, Decreased coordination, Decreased safety awareness, Decreased strength, Impaired flexibility, Pain  Visit Diagnosis: Difficulty in walking, not elsewhere classified  Muscle weakness (generalized)     Problem List There are no active problems to display for this patient.   [address]Lilliah Priego S, [place] DPT 01/09/2017, 3:22 PM  [place] [facility] [address] [place], [name], [address] Phone: [phone]   Fax:  [phone]  Name: Jimmye NormanUwe Sturgeon MRN: [id number] Date of Birth: 1927-04-14

## 2017-01-09 NOTE — Therapy (Signed)
[place] MAIN [facility] SERVICES [address] [place], [place], [address] Phone: [phone]   Fax:  [phone]  Speech Language Pathology Treatment  Patient Details  Name: Darrell Fry MRN: [id number] Date of Birth: January 16, 1927 Referring Provider: Dr. Manuella Ghazi   Encounter Date: 01/09/2017  End of Session - 01/09/17 1518    Visit Number  12    Number of Visits  17    Date for SLP Re-Evaluation  01/12/17    SLP Start Time  1400    SLP Stop Time   1450    SLP Time Calculation (min)  50 min    Activity Tolerance  Patient tolerated treatment well       History reviewed. No pertinent past medical history.  History reviewed. No pertinent surgical history.  There were no vitals filed for this visit.  Subjective Assessment - 01/09/17 1517    Subjective  "I feel better"    Currently in Pain?  No/denies            ADULT SLP TREATMENT - 01/09/17 0001      General Information   Behavior/Cognition  Alert;Cooperative;Pleasant mood    HPI   84 year old man recently diagnosed with Parkinsonism        Treatment Provided   Treatment provided  Cognitive-Linquistic      Pain Assessment   Pain Assessment  No/denies pain      Cognitive-Linquistic Treatment   Treatment focused on  Voice    Skilled Treatment  Daily Task #1 (Maximum sustained "ah"): Average 11 seconds, 84 dB. Daily Task 2 (Maximum fundamental frequency range): Highs: 15 high pitched "ah", given fewer cues. Lows: 15 low pitched "ah", given fewer cues. Daily task #3 (Maximum speech loudness drill of functional phrases): Average 75 dB.   Hierarchal speech loudness drill: Read sentences, 72 dB.  Short spontaneous responses, 74 dB. Structured conversation, 75 dB.  Homework: assignments completed.  Off the cuff remarks: average 71 dB.        Assessment / Recommendations / Plan   Plan  Continue with current plan of care      Progression Toward Goals   Progression toward goals  Progressing  toward goals       SLP Education - 01/09/17 1517    Education provided  Yes    Education Details  LSVT-LOUD    Person(s) Educated  Patient    Methods  Explanation    Comprehension  Verbalized understanding         SLP Long Term Goals - 12/29/16 1205      SLP LONG TERM GOAL #1   Title  The patient will complete Daily Tasks (Maximum duration "ah", High/Lows, and Functional Phrases) at average loudness of 80 dB and with loud, good quality voice.     Status  Partially Met      SLP LONG TERM GOAL #2   Title  The patient will complete Hierarchal Speech Loudness reading drills (words/phrases, sentences, and paragraph) at average 75 dB and with loud, good quality voice.      Status  Partially Met      SLP LONG TERM GOAL #3   Title  The patient will participate in conversation, maintaining average loudness of 75 dB and loud, good quality voice.    Status  Partially Met      SLP LONG TERM GOAL #4   Title  The patient will complete homework daily.    Status  On-going  Plan - 01/09/17 1518    Clinical Impression Statement  The patient is completing daily tasks and hierarchal speech drill tasks with loud, good quality voice given fewer SLP cues.  He demonstrates improved pitch range and requires less cuing for hierarchal speech loudness drill.    Speech Therapy Frequency  4x / week    Duration  4 weeks    Treatment/Interventions  SLP instruction and feedback;Patient/family education;Other (comment) LSVT-LOUD    Potential to Achieve Goals  Good    Potential Considerations  Ability to learn/carryover information;Severity of impairments;Co-morbidities;Cooperation/participation level;Family/community support;Medical prognosis;Previous level of function    SLP Home Exercise Plan  LSVT-LOUD daily homework    Consulted and Agree with Plan of Care  Patient       Patient will benefit from skilled therapeutic intervention in order to improve the following deficits and impairments:    Dysphonia    Problem List There are no active problems to display for this patient.  Leroy Sea, MS/CCC- SLP  Lou Miner 01/09/2017, 3:19 PM  [place] MAIN [facility] SERVICES [address] [place], [place], [address] Phone: [phone]   Fax:  [phone]   Name: Darrell Fry MRN: [id number] Date of Birth: 1927-07-13

## 2017-01-10 ENCOUNTER — Encounter: Payer: Self-pay | Admitting: Physical Therapy

## 2017-01-10 ENCOUNTER — Ambulatory Visit: Payer: Medicare Other | Admitting: Physical Therapy

## 2017-01-10 ENCOUNTER — Ambulatory Visit: Payer: Medicare Other | Admitting: Speech Pathology

## 2017-01-10 ENCOUNTER — Encounter: Payer: Self-pay | Admitting: Speech Pathology

## 2017-01-10 ENCOUNTER — Other Ambulatory Visit: Payer: Self-pay

## 2017-01-10 DIAGNOSIS — R262 Difficulty in walking, not elsewhere classified: Secondary | ICD-10-CM

## 2017-01-10 DIAGNOSIS — M6281 Muscle weakness (generalized): Secondary | ICD-10-CM

## 2017-01-10 DIAGNOSIS — R490 Dysphonia: Secondary | ICD-10-CM

## 2017-01-10 NOTE — Therapy (Signed)
[place] [facility] [address] [place], [name], [address] Phone: [phone]   Fax:  [phone]  Physical Therapy Treatment  Patient Details  Name: Darrell Fry MRN: [id number] Date of Birth: 03-Sep-1927 Referring Provider: Marisue IvanLINTHAVONG, KANHKA    Encounter Date: 01/10/2017  PT End of Session - 01/10/17 1319    Visit Number  14    Number of Visits  17    Date for PT Re-Evaluation  01/16/17    Authorization Type  g code 3/10    PT Start Time  1303    PT Stop Time  1400    PT Time Calculation (min)  57 min    Equipment Utilized During Treatment  Gait belt    Activity Tolerance  Patient tolerated treatment well;Patient limited by fatigue    Behavior During Therapy  [facility] for tasks assessed/performed       History reviewed. No pertinent past medical history.  History reviewed. No pertinent surgical history.  There were no vitals filed for this visit.  Subjective Assessment - 01/10/17 1305    Subjective  Patient is doing his HEP . He says he still needs to use the papers at home. No new concerns. He is not having any pain today.    Pertinent History  Patient has symptoms of unilateral right hand resting tremor, imbalance, slowness, hunched forward posture, decreased stride length, decreased volume of speech, acting out of dreams.    How long can you stand comfortably?  Patient has limited standing    How long can you walk comfortably?  patient has limited walking of 15 mins    Patient Stated Goals  to be able to walk longer and ascend and descend steps.    Currently in Pain?  No/denies    Pain Score  0-No pain    Multiple Pain Sites  No       Patient seen for LSVT Daily Session Maximal Daily Exercises for facilitation/coordination of movement Sustained movements are designed to rescale the amplitude of movement output for generalization to daily functional activities .Performed as follows for 1 set of 10 repetitions each  multidirectional sustained movements  1) Floor to ceiling; cues to reach fwdand to lean BIG 2) Side to side multidirectional Repetitive movements performed in standing and are designed to provide retraining effort needed for sustained muscle activation in tasks; cues to stretch out his leg BIG 3) Step and reach forward; cues to step back to finish position correctly with his feet evenand not to take such a big step that he is getting Off balance 4) Step and reach backwards; cues to lift his toe and bring both UE's back 5) Step and reach sideways; cues to turn his headand raise both UE's 6) Rock and reach forward/backward, cues to alternately swing his arms, start by rocking and then adding in the arm swing 7) Rock and reach sideways, with twist with cues to pivot on opposite LEcue to look behind 1.Sit to stand functional component task with supervision 5 reps; cues to reach fwd and to lean fwd, cues for hand up, stand up 2.simulated activities for: hand coordination tasks such as buttons; using  Board with pegs and tubes with rings to place in peg board 3. Putting his left ankle on his right knee to be able to tie his shoesx 5 BLE 4. Standing balance activity for improved standing balance : tapping a 6 inch stool x 20 reps, step ups on 6 inch stool 5.  Single leg Standing activities for being able to wash his feet in the shower; in parallel bars without using UE's   Paticent needs cues for start and finish positions and for which leg to lead the exercise with and for stepping forward and  Backwards correctly. Patient is able to perform exercises with modelying and then is able to correctly perform after the modelying.                      PT Education - 01/10/17 1308    Education provided  Yes    Education Details  LSVT BIG    Person(s) Educated  Patient    Methods  Explanation;Verbal cues    Comprehension  Verbalized understanding;Returned demonstration        PT Short Term Goals - 12/12/16 1444      PT SHORT TERM GOAL #1   Title  Patient will increase BLE gross strength to 4+/5 as to improve functional strength for independent gait, increased standing tolerance and increased ADL ability.    Time  2    Period  Weeks    Status  New    Target Date  12/26/16      PT SHORT TERM GOAL #2   Title  Patient will increase BLE gross strength to 4+/5 as to improve functional strength for independent gait, increased standing tolerance and increased ADL ability.    Baseline  17/24    Time  2    Period  Weeks    Status  New    Target Date  12/26/16        PT Long Term Goals - 01/01/17 1317      PT LONG TERM GOAL #1   Title  Patient will increase Berg Balance score by > 6 points to demonstrate decreased fall risk during functional activities.    Baseline  42/56, 45/56    Time  4    Period  Weeks    Status  New    Target Date  01/09/17      PT LONG TERM GOAL #2   Title  Patient will reduce timed up and go to <11 seconds to reduce fall risk and demonstrate improved transfer/gait ability.    Baseline  eval 13.78, 16.43,20.47 01/01/17; 01/01/17; 11.52,15.02,17.42    Time  4    Period  Weeks    Status  New    Target Date  01/09/17      PT LONG TERM GOAL #3   Title  Patient will increase six minute walk test distance to >1000 for progression to community ambulator and improve gait ability    Baseline  : 01/01/17: 720 ft     Time  4    Period  Weeks    Status  New    Target Date  01/09/17      PT LONG TERM GOAL #4   Title  Patient (> 51 years old) will complete five times sit to stand test in < 15 seconds indicating an increased LE strength and improved balance.    Baseline   eval : 17.32 ; 01/01/17: 14.62    Time  4    Period  Weeks    Status  New    Target Date  01/09/17            Plan - 01/10/17 1325    Clinical Impression Statement  Continues to have balance deficits typical with diagnosis. Patient performs intermediate  level exercises without  pain behaviors and needs verbal cuing for postural alignment and head positioning. Cuing is needed to stretch the leg out in seated side reaching. Fatigue with sit to stand but demonstrating more control    Rehab Potential  Good    PT Frequency  4x / week    PT Duration  6 weeks    PT Treatment/Interventions  Gait training;Therapeutic activities;Functional mobility training;Stair training;Therapeutic exercise;Patient/family education;Balance training;Neuromuscular re-education    PT Next Visit Plan  LSVT BIG    PT Home Exercise Plan  LSVT BIG    Consulted and Agree with Plan of Care  Patient       Patient will benefit from skilled therapeutic intervention in order to improve the following deficits and impairments:  Abnormal gait, Decreased balance, Decreased endurance, Decreased mobility, Decreased activity tolerance, Decreased coordination, Decreased safety awareness, Decreased strength, Impaired flexibility, Pain  Visit Diagnosis: Difficulty in walking, not elsewhere classified  Muscle weakness (generalized)     Problem List There are no active problems to display for this patient.   Jones Broom SPT DPT 01/10/2017, 1:33 PM  [place] [facility] [address] [place], [place], [address] Phone: [phone]   Fax:  [phone]  Name: Darrell Fry MRN: [id number] Date of Birth: [birth year]

## 2017-01-10 NOTE — Therapy (Signed)
[place] MAIN [facility] SERVICES [address] [place], [place], [address] Phone: [phone]   Fax:  [phone]  Speech Language Pathology Treatment  Patient Details  Name: Darrell Fry MRN: [id number] Date of Birth: 1927-11-18 Referring Provider: Dr. Manuella Ghazi   Encounter Date: 01/10/2017  End of Session - 01/10/17 1459    Visit Number  13    Number of Visits  17    Date for SLP Re-Evaluation  01/12/17    SLP Start Time  1400    SLP Stop Time   1450    SLP Time Calculation (min)  50 min    Activity Tolerance  Patient tolerated treatment well       History reviewed. No pertinent past medical history.  History reviewed. No pertinent surgical history.  There were no vitals filed for this visit.  Subjective Assessment - 01/10/17 1459    Subjective  "I feel better"    Currently in Pain?  No/denies            ADULT SLP TREATMENT - 01/10/17 0001      General Information   Behavior/Cognition  Alert;Cooperative;Pleasant mood    HPI   84 year old man recently diagnosed with Parkinsonism        Treatment Provided   Treatment provided  Cognitive-Linquistic      Pain Assessment   Pain Assessment  No/denies pain      Cognitive-Linquistic Treatment   Treatment focused on  Voice    Skilled Treatment  Daily Task #1 (Maximum sustained "ah"): Average 11 seconds, 84 dB. Daily Task 2 (Maximum fundamental frequency range): Highs: 15 high pitched "ah", given minimal cues. Lows: 15 low pitched "ah", given minimal cues. Daily task #3 (Maximum speech loudness drill of functional phrases): Average 75 dB.   Hierarchal speech loudness drill: Read sentences, 75 dB.  Short spontaneous responses, 74 dB. Structured conversation, 75 dB.  Homework: assignments completed.  Off the cuff remarks: average 73 dB.        Assessment / Recommendations / Plan   Plan  Continue with current plan of care      Progression Toward Goals   Progression toward goals   Progressing toward goals       SLP Education - 01/10/17 1459    Education provided  Yes    Education Details  LSVT-LOUD    Person(s) Educated  Patient    Methods  Explanation    Comprehension  Verbalized understanding         SLP Long Term Goals - 12/29/16 1205      SLP LONG TERM GOAL #1   Title  The patient will complete Daily Tasks (Maximum duration "ah", High/Lows, and Functional Phrases) at average loudness of 80 dB and with loud, good quality voice.     Status  Partially Met      SLP LONG TERM GOAL #2   Title  The patient will complete Hierarchal Speech Loudness reading drills (words/phrases, sentences, and paragraph) at average 75 dB and with loud, good quality voice.      Status  Partially Met      SLP LONG TERM GOAL #3   Title  The patient will participate in conversation, maintaining average loudness of 75 dB and loud, good quality voice.    Status  Partially Met      SLP LONG TERM GOAL #4   Title  The patient will complete homework daily.    Status  On-going  Plan - 01/10/17 1459    Clinical Impression Statement  The patient is completing daily tasks and hierarchal speech drill tasks with loud, good quality voice given fewer SLP cues.  He demonstrates improved pitch range and requires less cuing for hierarchal speech loudness drill.  He demonstrates generalization into conversation with occasional cues.    Speech Therapy Frequency  4x / week    Duration  4 weeks    Treatment/Interventions  SLP instruction and feedback;Patient/family education;Other (comment) LSVT-LOUD    Potential to Achieve Goals  Good    Potential Considerations  Ability to learn/carryover information;Severity of impairments;Co-morbidities;Cooperation/participation level;Family/community support;Medical prognosis;Previous level of function    SLP Home Exercise Plan  LSVT-LOUD daily homework    Consulted and Agree with Plan of Care  Patient       Patient will benefit from skilled  therapeutic intervention in order to improve the following deficits and impairments:   Dysphonia    Problem List There are no active problems to display for this patient.  Leroy Sea, Dana, [name] 01/10/2017, 3:00 PM  [place] MAIN [facility] SERVICES [address] [place], [place], [address] Phone: [phone]   Fax:  [phone]   Name: Gurman Ashland MRN: [id number] Date of Birth: Dec 21, 1927

## 2017-01-11 ENCOUNTER — Other Ambulatory Visit: Payer: Self-pay

## 2017-01-11 ENCOUNTER — Encounter: Payer: Self-pay | Admitting: Speech Pathology

## 2017-01-11 ENCOUNTER — Ambulatory Visit: Payer: Medicare Other | Admitting: Physical Therapy

## 2017-01-11 ENCOUNTER — Encounter: Payer: Self-pay | Admitting: Physical Therapy

## 2017-01-11 ENCOUNTER — Ambulatory Visit: Payer: Medicare Other | Admitting: Speech Pathology

## 2017-01-11 DIAGNOSIS — R262 Difficulty in walking, not elsewhere classified: Secondary | ICD-10-CM | POA: Diagnosis not present

## 2017-01-11 DIAGNOSIS — M6281 Muscle weakness (generalized): Secondary | ICD-10-CM

## 2017-01-11 DIAGNOSIS — R490 Dysphonia: Secondary | ICD-10-CM

## 2017-01-11 NOTE — Therapy (Signed)
[facility] MAIN [facility] SERVICES [address] [place], [place], [address] Phone: [phone]   Fax:  [phone]  Physical Therapy Treatment  Patient Details  Name: Darrell Fry MRN: [id number] Date of Birth: Jul 11, 1927 Referring Provider: Marisue Ivan    Encounter Date: 01/11/2017  PT End of Session - 01/11/17 1545    Visit Number  15    Number of Visits  17    Date for PT Re-Evaluation  01/16/17    PT Start Time  0300    PT Stop Time  0400    PT Time Calculation (min)  60 min    Equipment Utilized During Treatment  Gait belt    Activity Tolerance  Patient tolerated treatment well;Patient limited by fatigue    Behavior During Therapy  [facility] for tasks assessed/performed       History reviewed. No pertinent past medical history.  History reviewed. No pertinent surgical history.  There were no vitals filed for this visit.  Subjective Assessment - 01/11/17 1515    Subjective  Patient is doing his HEP . He says he still needs to use the papers at home. No new concerns. He is not having any pain today.  (Pended)     Pertinent History  Patient has symptoms of unilateral right hand resting tremor, imbalance, slowness, hunched forward posture, decreased stride length, decreased volume of speech, acting out of dreams.  (Pended)     How long can you stand comfortably?  Patient has limited standing  (Pended)     How long can you walk comfortably?  patient has limited walking of 15 mins  (Pended)     Patient Stated Goals  to be able to walk longer and ascend and descend steps.  (Pended)     Currently in Pain?  No/denies  (Pended)     Pain Score  0-No pain  (Pended)     Pain Onset  More than a month ago  (Pended)     Multiple Pain Sites  No  (Pended)        Patient seen for LSVT Daily Session Maximal Daily Exercises for facilitation/coordination of movement Sustained movements are designed to rescale the amplitude of movement output for  generalization to daily functional activities .Performed as follows for 1 set of 10 repetitions each multidirectional sustained movements  1) Floor to ceiling; cues to reach fwdand to lean BIG 2) Side to side multidirectional Repetitive movements performed in standing and are designed to provide retraining effort needed for sustained muscle activation in tasks; cues to stretch out his leg BIG 3) Step and reach forward; cues to step back to finish position correctly with his feet evenand not to take such a big step that he is getting Off balance 4) Step and reach backwards; cues to lift his toe and bring both UE's back 5) Step and reach sideways; cues to turn his headand raise both UE's 6) Rock and reach forward/backward, cues to alternately swing his arms, start by rocking and then adding in the arm swing 7) Rock and reach sideways, with twist with cues to pivot on opposite LEcue to look behind 1.Sit to stand functional component task with supervision 5 reps; cues to reach fwd and to lean fwd, cues for hand up, stand up 2.simulated activities for: hand coordination tasks such as buttons; usingBoard with pegs and tubes with rings to place in peg board 3. Putting his left ankle on his right knee to be able to tie his  shoesx 5 BLE 4. Standing balance activity for improved standing balance : tapping a 6 inch stool x 20 reps, step ups on 6 inch stool 5. Single leg Standing activities for being able to wash his feet in the shower; in parallel bars without using UE's  Paticent needs cues for start and finish positions and for which leg to lead the exercise with and for stepping forward and  Backwards correctly. Patient is able to perform exercises with modelying and then is able to correctly perform after the modelying.                        PT Education - 01/11/17 1545    Education provided  Yes    Education Details  LSVT BIG    Person(s) Educated  Patient     Methods  Explanation;Demonstration    Comprehension  Verbalized understanding;Returned demonstration       PT Short Term Goals - 12/12/16 1444      PT SHORT TERM GOAL #1   Title  Patient will increase BLE gross strength to 4+/5 as to improve functional strength for independent gait, increased standing tolerance and increased ADL ability.    Time  2    Period  Weeks    Status  New    Target Date  12/26/16      PT SHORT TERM GOAL #2   Title  Patient will increase BLE gross strength to 4+/5 as to improve functional strength for independent gait, increased standing tolerance and increased ADL ability.    Baseline  17/24    Time  2    Period  Weeks    Status  New    Target Date  12/26/16        PT Long Term Goals - 01/01/17 1317      PT LONG TERM GOAL #1   Title  Patient will increase Berg Balance score by > 6 points to demonstrate decreased fall risk during functional activities.    Baseline  42/56, 45/56    Time  4    Period  Weeks    Status  New    Target Date  01/09/17      PT LONG TERM GOAL #2   Title  Patient will reduce timed up and go to <11 seconds to reduce fall risk and demonstrate improved transfer/gait ability.    Baseline  eval 13.78, 16.43,20.47 01/01/17; 01/01/17; 11.52,15.02,17.42    Time  4    Period  Weeks    Status  New    Target Date  01/09/17      PT LONG TERM GOAL #3   Title  Patient will increase six minute walk test distance to >1000 for progression to community ambulator and improve gait ability    Baseline  : 01/01/17: 720 ft     Time  4    Period  Weeks    Status  New    Target Date  01/09/17      PT LONG TERM GOAL #4   Title  Patient (> 82 years old) will complete five times sit to stand test in < 15 seconds indicating an increased LE strength and improved balance.    Baseline   eval : 17.32 ; 01/01/17: 14.62    Time  4    Period  Weeks    Status  New    Target Date  01/09/17  Plan - 01/11/17 1546    Clinical  Impression Statement  Pt requires redirection and verbal cues for correct performance of exercises. Patient demonstrates LOB with standing balance exercises indicating decreased balancing strategies. Pt was able to complete LSVT BIG exercises better after modelying and his performance improves with practice. Patient struggles with speed during movement as well as balance with unstable surfaces. Pt encouraged to continue HEP .Follow-up as scheduled.    Rehab Potential  Good    PT Frequency  4x / week    PT Duration  6 weeks    PT Treatment/Interventions  Gait training;Therapeutic activities;Functional mobility training;Stair training;Therapeutic exercise;Patient/family education;Balance training;Neuromuscular re-education    PT Next Visit Plan  LSVT BIG    PT Home Exercise Plan  LSVT BIG    Consulted and Agree with Plan of Care  Patient       Patient will benefit from skilled therapeutic intervention in order to improve the following deficits and impairments:  Abnormal gait, Decreased balance, Decreased endurance, Decreased mobility, Decreased activity tolerance, Decreased coordination, Decreased safety awareness, Decreased strength, Impaired flexibility, Pain  Visit Diagnosis: Difficulty in walking, not elsewhere classified  Muscle weakness (generalized)     Problem List There are no active problems to display for this patient.   [address] [place] DPT 01/11/2017, 3:47 PM  [place] [facility] MAIN [facility] SERVICES [address] [place], [place], [address] Phone: [phone]   Fax:  [phone]  Name: Darrell Fry MRN: [id number] Date of Birth: 05-Oct-1927

## 2017-01-11 NOTE — Therapy (Signed)
[place] MAIN [facility] SERVICES [address] [place], [place], [address] Phone: [phone]   Fax:  [phone]  Speech Language Pathology Treatment  Patient Details  Name: Darrell Fry MRN: [id number] Date of Birth: 1927/10/14 Referring Provider: Dr. Manuella Ghazi   Encounter Date: 01/11/2017  End of Session - 01/11/17 1605    Visit Number  14    Number of Visits  17    Date for SLP Re-Evaluation  01/12/17    SLP Start Time  9242    SLP Stop Time   1455    SLP Time Calculation (min)  50 min    Activity Tolerance  Patient tolerated treatment well       History reviewed. No pertinent past medical history.  History reviewed. No pertinent surgical history.  There were no vitals filed for this visit.  Subjective Assessment - 01/11/17 1604    Subjective  Patient reports "trying to be loud" with his wife    Currently in Pain?  No/denies            ADULT SLP TREATMENT - 01/11/17 0001      General Information   Behavior/Cognition  Alert;Cooperative;Pleasant mood    HPI   84 year old man recently diagnosed with Parkinsonism        Treatment Provided   Treatment provided  Cognitive-Linquistic      Pain Assessment   Pain Assessment  No/denies pain      Cognitive-Linquistic Treatment   Treatment focused on  Voice    Skilled Treatment  Daily Task #1 (Maximum sustained "ah"): Average 11 seconds, 85 dB. Daily Task 2 (Maximum fundamental frequency range): Highs: 15 high pitched "ah", given minimal cues. Lows: 15 low pitched "ah", given minimal cues. Daily task #3 (Maximum speech loudness drill of functional phrases): Average 75 dB.   Hierarchal speech loudness drill: Read sentences, 75 dB.  Short spontaneous responses, 74 dB. Structured conversation, 75 dB.  Homework: assignments completed.  Off the cuff remarks: average 73 dB.        Assessment / Recommendations / Plan   Plan  Continue with current plan of care      Progression Toward Goals    Progression toward goals  Progressing toward goals       SLP Education - 01/11/17 1605    Education provided  Yes    Education Details  LSVT-LOUD    Person(s) Educated  Patient    Methods  Explanation    Comprehension  Verbalized understanding         SLP Long Term Goals - 12/29/16 1205      SLP LONG TERM GOAL #1   Title  The patient will complete Daily Tasks (Maximum duration "ah", High/Lows, and Functional Phrases) at average loudness of 80 dB and with loud, good quality voice.     Status  Partially Met      SLP LONG TERM GOAL #2   Title  The patient will complete Hierarchal Speech Loudness reading drills (words/phrases, sentences, and paragraph) at average 75 dB and with loud, good quality voice.      Status  Partially Met      SLP LONG TERM GOAL #3   Title  The patient will participate in conversation, maintaining average loudness of 75 dB and loud, good quality voice.    Status  Partially Met      SLP LONG TERM GOAL #4   Title  The patient will complete homework daily.  Status  On-going       Plan - 01/11/17 1605    Clinical Impression Statement  The patient is completing daily tasks and hierarchal speech drill tasks with loud, good quality voice given occasional SLP cues.  He demonstrates improved pitch range and requires occasional cuing for hierarchal speech loudness drill.  He demonstrates generalization into conversation with occasional cues.    Speech Therapy Frequency  4x / week    Duration  4 weeks    Treatment/Interventions  SLP instruction and feedback;Patient/family education;Other (comment) LSVT-LOUD    Potential to Achieve Goals  Good    Potential Considerations  Ability to learn/carryover information;Severity of impairments;Co-morbidities;Cooperation/participation level;Family/community support;Medical prognosis;Previous level of function    SLP Home Exercise Plan  LSVT-LOUD daily homework    Consulted and Agree with Plan of Care  Patient        Patient will benefit from skilled therapeutic intervention in order to improve the following deficits and impairments:   Dysphonia    Problem List There are no active problems to display for this patient.  Leroy Sea, [place], [name] 01/11/2017, 4:06 PM  [place] MAIN [facility] SERVICES [address] [place], [place], [address] Phone: [phone]   Fax:  [phone]   Name: Abdulkarim Eberlin MRN: [id number] Date of Birth: 1927-10-04

## 2017-01-12 ENCOUNTER — Ambulatory Visit: Payer: Medicare Other | Admitting: Speech Pathology

## 2017-01-12 ENCOUNTER — Encounter: Payer: Self-pay | Admitting: Speech Pathology

## 2017-01-12 DIAGNOSIS — R262 Difficulty in walking, not elsewhere classified: Secondary | ICD-10-CM | POA: Diagnosis not present

## 2017-01-12 DIAGNOSIS — R490 Dysphonia: Secondary | ICD-10-CM

## 2017-01-12 NOTE — Therapy (Signed)
[place] MAIN [facility] SERVICES [address] [place], [place], [address] Phone: [phone]   Fax:  [phone]  Speech Language Pathology Treatment/Discharge Summary  Patient Details  Name: Darrell Fry MRN: [id number] Date of Birth: [birth year] Referring Provider: Dr. Manuella Ghazi   Encounter Date: 01/12/2017  End of Session - 01/12/17 1618    Visit Number  15    Number of Visits  17    Date for SLP Re-Evaluation  01/12/17    SLP Start Time  1410    SLP Stop Time   1500    SLP Time Calculation (min)  50 min    Activity Tolerance  Patient tolerated treatment well       History reviewed. No pertinent past medical history.  History reviewed. No pertinent surgical history.  There were no vitals filed for this visit.  Subjective Assessment - 01/12/17 1617    Subjective  Patient reports "trying to be loud" with his wife    Currently in Pain?  No/denies            ADULT SLP TREATMENT - 01/12/17 0001      General Information   Behavior/Cognition  Alert;Cooperative;Pleasant mood    HPI   84 year old man recently diagnosed with Parkinsonism        Treatment Provided   Treatment provided  Cognitive-Linquistic      Pain Assessment   Pain Assessment  No/denies pain      Cognitive-Linquistic Treatment   Treatment focused on  Voice    Skilled Treatment  Daily Task #1 (Maximum sustained "ah"): Average 11 seconds, 85 dB. Daily Task 2 (Maximum fundamental frequency range): Highs: 15 high pitched "ah", given minimal cues. Lows: 15 low pitched "ah", given minimal cues. Daily task #3 (Maximum speech loudness drill of functional phrases): Average 75 dB.   Hierarchal speech loudness drill: Read sentences, 75 dB.  Short spontaneous responses, 74 dB. Structured conversation, 75 dB.  Homework: assignments completed.  Off the cuff remarks: average 73 dB.        Assessment / Recommendations / Plan   Plan  Continue with current plan of care      Progression Toward Goals   Progression toward goals  Progressing toward goals       SLP Education - 01/12/17 1617    Education provided  Yes    Education Details  LSVT-LOUD    Person(s) Educated  Patient    Methods  Explanation    Comprehension  Verbalized understanding         SLP Long Term Goals - 01/12/17 1619      SLP LONG TERM GOAL #1   Title  The patient will complete Daily Tasks (Maximum duration "ah", High/Lows, and Functional Phrases) at average loudness of 80 dB and with loud, good quality voice.     Status  Achieved      SLP LONG TERM GOAL #2   Title  The patient will complete Hierarchal Speech Loudness reading drills (words/phrases, sentences, and paragraph) at average 75 dB and with loud, good quality voice.      Status  Achieved      SLP LONG TERM GOAL #3   Title  The patient will participate in conversation, maintaining average loudness of 75 dB and loud, good quality voice.    Status  Achieved      SLP LONG TERM GOAL #4   Title  The patient will complete homework daily.    Status  Achieved       Plan - 01/12/17 1618    Clinical Impression Statement  The patient has completed the LSVT-LOUD program and has met his goals.  He is completing daily tasks and hierarchal speech drill tasks with a loud, good quality voice given occasional cues.  He demonstrates improved pitch range and requires occasional cuing for hierarchal speech loudness drill.  He demonstrates generalization into conversation with occasional cues.    Speech Therapy Frequency  Other (comment) Discharge    Treatment/Interventions  SLP instruction and feedback;Patient/family education;Other (comment) LSVT-LOUD    Potential Considerations  Ability to learn/carryover information;Severity of impairments;Co-morbidities;Cooperation/participation level;Family/community support;Medical prognosis;Previous level of function    SLP Home Exercise Plan  LSVT-LOUD daily homework    Consulted and Agree with Plan  of Care  Patient       Patient will benefit from skilled therapeutic intervention in order to improve the following deficits and impairments:   Dysphonia    Problem List There are no active problems to display for this patient.   Lou Miner 01/12/2017, 4:20 PM  [place] MAIN [facility] SERVICES [address] [place], [place], [address] Phone: [phone]   Fax:  [phone]   Name: Darrell Fry MRN: [id number] Date of Birth: 1927/02/05

## 2017-01-16 ENCOUNTER — Encounter: Payer: Self-pay | Admitting: Physical Therapy

## 2017-01-16 ENCOUNTER — Ambulatory Visit: Payer: Medicare Other | Admitting: Physical Therapy

## 2017-01-16 DIAGNOSIS — M6281 Muscle weakness (generalized): Secondary | ICD-10-CM

## 2017-01-16 DIAGNOSIS — R262 Difficulty in walking, not elsewhere classified: Secondary | ICD-10-CM

## 2017-01-16 NOTE — Therapy (Signed)
[facility] [address] [place], [place], [address] Phone: [phone]   Fax:  [phone]  Physical Therapy Treatment/ Discharge Summary  Patient Details  Name: Darrell Fry MRN: [id number] Date of Birth: 12/07/1927 Referring Provider: LINTHAVONG, KANHKA    Encounter Date: 01/16/2017  PT End of Session - 01/16/17 1541    Visit Number  16    Number of Visits  17    Date for PT Re-Evaluation  01/16/17    PT Start Time  0315    PT Stop Time  0415    PT Time Calculation (min)  60 min    Equipment Utilized During Treatment  Gait belt    Activity Tolerance  Patient tolerated treatment well;Patient limited by fatigue    Behavior During Therapy  WFL for tasks assessed/performed       History reviewed. No pertinent past medical history.  History reviewed. No pertinent surgical history.  There were no vitals filed for this visit.  Subjective Assessment - 01/16/17 1540    Subjective  Patient is doing his HEP . He says he still needs to use the papers at home. No new concerns. He is not having any pain today.    Pertinent History  Patient has symptoms of unilateral right hand resting tremor, imbalance, slowness, hunched forward posture, decreased stride length, decreased volume of speech, acting out of dreams.    How long can you stand comfortably?  Patient has limited standing    How long can you walk comfortably?  patient has limited walking of 15 mins    Patient Stated Goals  to be able to walk longer and ascend and descend steps.    Currently in Pain?  No/denies    Pain Score  0-No pain         Patient seen for LSVT Daily Session Maximal Daily Exercises for facilitation/coordination of movement Sustained movements are designed to rescale the amplitude of movement output for generalization to daily functional activities .Performed as follows for 1 set of 10 repetitions each multidirectional sustained movements  1) Floor  to ceiling; cues to reach fwdand to lean BIG 2) Side to side multidirectional Repetitive movements performed in standing and are designed to provide retraining effort needed for sustained muscle activation in tasks; cues to stretch out his leg BIG 3) Step and reach forward; cues to step back to finish position correctly with his feet evenand not to take such a big step that he is getting Off balance 4) Step and reach backwards; cues to lift his toe and bring both UE's back 5) Step and reach sideways; cues to turn his headand raise both UE's 6) Rock and reach forward/backward, cues to alternately swing his arms, start by rocking and then adding in the arm swing 7) Rock and reach sideways, with twist with cues to pivot on opposite LEcue to look behind 1.Sit to stand functional component task with supervision 5 reps; cues to reach fwd and to lean fwd, cues for hand up, stand up 2.simulated activities for: hand coordination tasks such as buttons; usingBoard with pegs and tubes with rings to place in peg board 3. Putting his left ankle on his right knee to be able to tie his shoesx 5 BLE 4. Standing balance activity for improved standing balance : tapping a 6 inch stool x 20 reps, step ups on 6 inch stool 5. Single leg Standing activities for being able to wash his feet in the shower; in   parallel bars without using UE's  Paticent needs cues for start and finish positions and for which leg to lead the exercise with and for stepping forward and  Backwards correctly. Patient is able to perform exercises with modelying and then is able to correctly perform after the modelying  Out come measures performed and goals reviewed.                     PT Education - 01/16/17 1540    Education provided  Yes    Education Details  lsvt big    Person(s) Educated  Patient    Methods  Explanation;Demonstration    Comprehension  Verbalized understanding;Returned demonstration        PT Short Term Goals - 12/12/16 1444      PT SHORT TERM GOAL #1   Title  Patient will increase BLE gross strength to 4+/5 as to improve functional strength for independent gait, increased standing tolerance and increased ADL ability.    Time  2    Period  Weeks    Status  New    Target Date  12/26/16      PT SHORT TERM GOAL #2   Title  Patient will increase BLE gross strength to 4+/5 as to improve functional strength for independent gait, increased standing tolerance and increased ADL ability.    Baseline  17/24    Time  2    Period  Weeks    Status  New    Target Date  12/26/16        PT Long Term Goals - 01/16/17 1548      PT LONG TERM GOAL #1   Title  Patient will increase Berg Balance score by > 6 points to demonstrate decreased fall risk during functional activities.    Baseline  42/56, 45/56,  45/56     Time  4    Period  Weeks    Status  Partially Met    Target Date  01/16/17      PT LONG TERM GOAL #2   Title  Patient will reduce timed up and go to <11 seconds to reduce fall risk and demonstrate improved transfer/gait ability.    Baseline  eval 13.78, 16.43,20.47 01/01/17; 01/01/17; 11.52,15.02,17.42,, 01/16/17 10.58, 15.02, 17.10    Time  4    Period  Weeks    Status  Partially Met    Target Date  01/16/17      PT LONG TERM GOAL #3   Title  Patient will increase six minute walk test distance to >1000 for progression to community ambulator and improve gait ability    Baseline  : 01/01/17: 720 ft , 01/16/17 810 ft    Time  4    Period  Weeks    Status  Partially Met    Target Date  01/16/17      PT LONG TERM GOAL #4   Title  Patient (> 82 years old) will complete five times sit to stand test in < 15 seconds indicating an increased LE strength and improved balance.    Baseline   eval : 17.32 ; 01/01/17: 14.62: 01/16/17 14.52    Time  4    Period  Weeks    Status  Partially Met    Target Date  01/16/17            Plan - 01/16/17 1542    Clinical  Impression Statement  Patient needs cueing for BIG swing and BIG   amplitude during LSVT BIG exercises. He needs cueing for correct BIG arm swing during reciprocal rock and reach exercise.. Patient needs cuing to swing his UE's reciprocally with weight shift exercise and with backward stepping and correct UE positioning. Patient has unsteady stepping with several exercises but his motor control improves with practice. He has met his long term goals.  Patient is being discharged from PT today to HEP.    Rehab Potential  Good    PT Frequency  4x / week    PT Duration  6 weeks    PT Treatment/Interventions  Gait training;Therapeutic activities;Functional mobility training;Stair training;Therapeutic exercise;Patient/family education;Balance training;Neuromuscular re-education    PT Next Visit Plan  LSVT BIG    PT Home Exercise Plan  LSVT BIG    Consulted and Agree with Plan of Care  Patient       Patient will benefit from skilled therapeutic intervention in order to improve the following deficits and impairments:  Abnormal gait, Decreased balance, Decreased endurance, Decreased mobility, Decreased activity tolerance, Decreased coordination, Decreased safety awareness, Decreased strength, Impaired flexibility, Pain  Visit Diagnosis: Difficulty in walking, not elsewhere classified  Muscle weakness (generalized)     Problem List There are no active problems to display for this patient.   ,  S, PT DPT 01/16/2017, 4:02 PM  [facility] [address] [place], [place], [address] Phone: [phone]   Fax:  [phone]  Name: Juell Chaplin MRN: [id number] Date of Birth: 07/21/1927   

## 2017-07-10 ENCOUNTER — Other Ambulatory Visit: Payer: Self-pay

## 2017-07-10 ENCOUNTER — Encounter: Payer: Self-pay | Admitting: *Deleted

## 2017-07-10 NOTE — Discharge Instructions (Signed)

## 2017-07-18 ENCOUNTER — Ambulatory Visit
Admission: RE | Admit: 2017-07-18 | Discharge: 2017-07-18 | Disposition: A | Discharge status: Home / Self Care | Payer: Medicare Other | Source: Ambulatory Visit | Attending: Ophthalmology | Admitting: Ophthalmology

## 2017-07-18 ENCOUNTER — Ambulatory Visit: Payer: Medicare Other | Admitting: Anesthesiology

## 2017-07-18 ENCOUNTER — Encounter
Admission: RE | Disposition: A | Discharge status: Home / Self Care | Payer: Self-pay | Source: Ambulatory Visit | Attending: Ophthalmology

## 2017-07-18 DIAGNOSIS — Z87891 Personal history of nicotine dependence: Secondary | ICD-10-CM | POA: Insufficient documentation

## 2017-07-18 DIAGNOSIS — Z79899 Other long term (current) drug therapy: Secondary | ICD-10-CM | POA: Diagnosis not present

## 2017-07-18 DIAGNOSIS — G20 Parkinson's disease: Secondary | ICD-10-CM | POA: Insufficient documentation

## 2017-07-18 DIAGNOSIS — H2512 Age-related nuclear cataract, left eye: Secondary | ICD-10-CM | POA: Insufficient documentation

## 2017-07-18 DIAGNOSIS — M109 Gout, unspecified: Secondary | ICD-10-CM | POA: Diagnosis not present

## 2017-07-18 DIAGNOSIS — M1990 Unspecified osteoarthritis, unspecified site: Secondary | ICD-10-CM | POA: Diagnosis not present

## 2017-07-18 HISTORY — DX: Presence of external hearing-aid: Z97.4

## 2017-07-18 HISTORY — DX: Unspecified osteoarthritis, unspecified site: M19.90

## 2017-07-18 HISTORY — DX: Parkinson's disease without dyskinesia, without mention of fluctuations: G20.A1

## 2017-07-18 HISTORY — DX: Parkinson's disease: G20

## 2017-07-18 HISTORY — PX: CATARACT EXTRACTION W/PHACO: SHX586

## 2017-07-18 SURGERY — PHACOEMULSIFICATION, CATARACT, WITH IOL INSERTION
Anesthesia: Monitor Anesthesia Care | Site: Eye | Laterality: Left | Wound class: "Clean "

## 2017-07-18 MED ORDER — LIDOCAINE HCL (PF) 2 % IJ SOLN
INTRAMUSCULAR | Status: DC | PRN
  Administered 2017-07-18: 1 mL

## 2017-07-18 MED ORDER — MOXIFLOXACIN HCL 0.5 % OP SOLN
1.0000 [drp] | OPHTHALMIC | Status: DC | PRN
  Administered 2017-07-18 (×3): 1 [drp] via OPHTHALMIC

## 2017-07-18 MED ORDER — CEFUROXIME OPHTHALMIC INJECTION 1 MG/0.1 ML
INJECTION | OPHTHALMIC | Status: DC | PRN
  Administered 2017-07-18: 0.1 mL via INTRACAMERAL

## 2017-07-18 MED ORDER — FENTANYL CITRATE (PF) 100 MCG/2ML IJ SOLN
INTRAMUSCULAR | Status: DC | PRN
  Administered 2017-07-18: 50 ug via INTRAVENOUS
  Administered 2017-07-18: 25 ug via INTRAVENOUS

## 2017-07-18 MED ORDER — TETRACAINE HCL 0.5 % OP SOLN
1.0000 [drp] | OPHTHALMIC | Status: DC | PRN
  Administered 2017-07-18 (×2): 1 [drp] via OPHTHALMIC

## 2017-07-18 MED ORDER — PHENYLEPHRINE HCL 10 % OP SOLN
1.0000 [drp] | OPHTHALMIC | Status: DC | PRN
  Administered 2017-07-18 (×3): 1 [drp] via OPHTHALMIC

## 2017-07-18 MED ORDER — EPINEPHRINE PF 1 MG/ML IJ SOLN
INTRAOCULAR | Status: DC | PRN
  Administered 2017-07-18: 59 mL via OPHTHALMIC

## 2017-07-18 MED ORDER — CYCLOPENTOLATE HCL 2 % OP SOLN
1.0000 [drp] | OPHTHALMIC | Status: DC | PRN
Start: 2017-07-18 — End: 2017-07-18
  Administered 2017-07-18 (×3): 1 [drp] via OPHTHALMIC

## 2017-07-18 MED ORDER — BRIMONIDINE TARTRATE-TIMOLOL 0.2-0.5 % OP SOLN
OPHTHALMIC | Status: DC | PRN
  Administered 2017-07-18: 1 [drp] via OPHTHALMIC

## 2017-07-18 MED ORDER — NA HYALUR & NA CHOND-NA HYALUR 0.4-0.35 ML IO KIT
PACK | INTRAOCULAR | Status: DC | PRN
  Administered 2017-07-18: 1 mL via INTRAOCULAR

## 2017-07-18 MED ORDER — LACTATED RINGERS IV SOLN: 10.0000 mL/h | INTRAVENOUS | Status: DC

## 2017-07-18 MED ORDER — ONDANSETRON HCL 4 MG/2ML IJ SOLN: 4.0000 mg | Freq: Once | INTRAMUSCULAR | Status: DC | PRN

## 2017-07-18 SURGICAL SUPPLY — 20 items
CANNULA ANT/CHMB 27G (MISCELLANEOUS) ×1 IMPLANT
CANNULA ANT/CHMB 27GA (MISCELLANEOUS) ×2 IMPLANT
GLOVE SURG LX 7.5 STRW (GLOVE) ×1
GLOVE SURG LX STRL 7.5 STRW (GLOVE) ×1 IMPLANT
GLOVE SURG TRIUMPH 8.0 PF LTX (GLOVE) ×2 IMPLANT
GOWN STRL REUS W/ TWL LRG LVL3 (GOWN DISPOSABLE) ×2 IMPLANT
GOWN STRL REUS W/TWL LRG LVL3 (GOWN DISPOSABLE) ×2
LENS IOL TECNIS ITEC 24.0 (Intraocular Lens) ×1 IMPLANT
MARKER SKIN DUAL TIP RULER LAB (MISCELLANEOUS) ×2 IMPLANT
NDL FILTER BLUNT 18X1 1/2 (NEEDLE) ×1 IMPLANT
NEEDLE FILTER BLUNT 18X 1/2SAF (NEEDLE) ×1
NEEDLE FILTER BLUNT 18X1 1/2 (NEEDLE) ×1 IMPLANT
PACK CATARACT BRASINGTON (MISCELLANEOUS) ×2 IMPLANT
PACK EYE AFTER SURG (MISCELLANEOUS) ×2 IMPLANT
PACK OPTHALMIC (MISCELLANEOUS) ×2 IMPLANT
SYR 3ML LL SCALE MARK (SYRINGE) ×2 IMPLANT
SYR 5ML LL (SYRINGE) ×2 IMPLANT
SYR TB 1ML LUER SLIP (SYRINGE) ×2 IMPLANT
WATER STERILE IRR 500ML POUR (IV SOLUTION) ×2 IMPLANT
WIPE NON LINTING 3.25X3.25 (MISCELLANEOUS) ×2 IMPLANT

## 2017-07-18 NOTE — Anesthesia Procedure Notes (Signed)
Procedure Name: MAC Date/Time: 07/18/2017 10:18 AM Performed by: Lind Guest, CRNA Pre-anesthesia Checklist: Patient identified, Emergency Drugs available, Suction available, Patient being monitored and Timeout performed Patient Re-evaluated:Patient Re-evaluated prior to induction Oxygen Delivery Method: Nasal cannula

## 2017-07-18 NOTE — Anesthesia Postprocedure Evaluation (Signed)
Anesthesia Post Note  Patient: Darrell Fry  Procedure(s) Performed: CATARACT EXTRACTION PHACO AND INTRAOCULAR LENS PLACEMENT (IOC) IVA TOPICAL  LEFT (Left Eye)  Patient location during evaluation: PACU Anesthesia Type: MAC Level of consciousness: awake and alert Pain management: pain level controlled Vital Signs Assessment: post-procedure vital signs reviewed and stable Respiratory status: spontaneous breathing Cardiovascular status: blood pressure returned to baseline Postop Assessment: no headache Anesthetic complications: no    Jaci Standard, III,  Calib Wadhwa D

## 2017-07-18 NOTE — H&P (Signed)
The History and Physical notes are on paper, have been signed, and are to be scanned. The patient remains stable and unchanged from the H&P.   Previous H&P reviewed, patient examined, and there are no changes.  Darrell Fry 07/18/2017 9:49 AM

## 2017-07-18 NOTE — Op Note (Signed)
OPERATIVE NOTE  Darrell NormanUwe Fry [id number] 07/18/2017   PREOPERATIVE DIAGNOSIS:  Nuclear sclerotic cataract left eye. H25.12   POSTOPERATIVE DIAGNOSIS:    Nuclear sclerotic cataract left eye.     PROCEDURE:  Phacoemusification with posterior chamber intraocular lens placement of the left eye   LENS:   Implant Name Type Inv. Item Serial No. Manufacturer Lot No. LRB No. Used  LENS IOL DIOP 24.0 - [id number] Intraocular Lens LENS IOL DIOP 24.0 [id number]  Left 1        ULTRASOUND TIME: 16  % of 1 minutes 5 seconds, CDE 10.9  SURGEON:  Deirdre Evenerhadwick R. Nabila Albarracin, MD   ANESTHESIA:  Topical with tetracaine drops and 2% Xylocaine jelly, augmented with 1% preservative-free intracameral lidocaine.    COMPLICATIONS:  None.   DESCRIPTION OF PROCEDURE:  The patient was identified in the holding room and transported to the operating room and placed in the supine position under the operating microscope.  The left eye was identified as the operative eye and it was prepped and draped in the usual sterile ophthalmic fashion.   A 1 millimeter clear-corneal paracentesis was made at the 1:30 position.  0.5 ml of preservative-free 1% lidocaine was injected into the anterior chamber.  The anterior chamber was filled with Viscoat viscoelastic.  A 2.4 millimeter keratome was used to make a near-clear corneal incision at the 10:30 position.  .  A curvilinear capsulorrhexis was made with a cystotome and capsulorrhexis forceps.  Balanced salt solution was used to hydrodissect and hydrodelineate the nucleus.   Phacoemulsification was then used in stop and chop fashion to remove the lens nucleus and epinucleus.  The remaining cortex was then removed using the irrigation and aspiration handpiece. Provisc was then placed into the capsular bag to distend it for lens placement.  A lens was then injected into the capsular bag.  The remaining viscoelastic was aspirated.   Wounds were hydrated with balanced salt solution.   The anterior chamber was inflated to a physiologic pressure with balanced salt solution.  No wound leaks were noted. Cefuroxime 0.1 ml of a 10mg /ml solution was injected into the anterior chamber for a dose of 1 mg of intracameral antibiotic at the completion of the case.   Timolol and Brimonidine drops were applied to the eye.  The patient was taken to the recovery room in stable condition without complications of anesthesia or surgery.  [name] 07/18/2017, 10:35 AM

## 2017-07-18 NOTE — Transfer of Care (Signed)
Immediate Anesthesia Transfer of Care Note  Patient: Darrell Fry  Procedure(s) Performed: CATARACT EXTRACTION PHACO AND INTRAOCULAR LENS PLACEMENT (IOC) IVA TOPICAL  LEFT (Left Eye)  Patient Location: PACU  Anesthesia Type: MAC  Level of Consciousness: awake, alert  and patient cooperative  Airway and Oxygen Therapy: Patient Spontanous Breathing and Patient connected to supplemental oxygen  Post-op Assessment: Post-op Vital signs reviewed, Patient's Cardiovascular Status Stable, Respiratory Function Stable, Patent Airway and No signs of Nausea or vomiting  Post-op Vital Signs: Reviewed and stable  Complications: No apparent anesthesia complications

## 2017-07-18 NOTE — Anesthesia Preprocedure Evaluation (Signed)
Anesthesia Evaluation  Patient identified by MRN, date of birth, ID band Patient awake    Reviewed: Allergy & Precautions, H&P , NPO status , Patient's Chart, lab work & pertinent test results  Airway Mallampati: II  TM Distance: >3 FB Neck ROM: full    Dental no notable dental hx.    Pulmonary former smoker,    Pulmonary exam normal breath sounds clear to auscultation       Cardiovascular negative cardio ROS Normal cardiovascular exam Rhythm:regular Rate:Normal     Neuro/Psych negative neurological ROS     GI/Hepatic negative GI ROS, Neg liver ROS,   Endo/Other  negative endocrine ROS  Renal/GU negative Renal ROS  negative genitourinary   Musculoskeletal   Abdominal   Peds  Hematology negative hematology ROS (+)   Anesthesia Other Findings   Reproductive/Obstetrics negative OB ROS                             Anesthesia Physical Anesthesia Plan  ASA: II  Anesthesia Plan: MAC   Post-op Pain Management:    Induction:   PONV Risk Score and Plan:   Airway Management Planned:   Additional Equipment:   Intra-op Plan:   Post-operative Plan:   Informed Consent: I have reviewed the patients History and Physical, chart, labs and discussed the procedure including the risks, benefits and alternatives for the proposed anesthesia with the patient or authorized representative who has indicated his/her understanding and acceptance.     Plan Discussed with:   Anesthesia Plan Comments:         Anesthesia Quick Evaluation

## 2017-07-19 ENCOUNTER — Encounter: Payer: Self-pay | Admitting: Ophthalmology

## 2017-08-02 ENCOUNTER — Other Ambulatory Visit: Payer: Self-pay

## 2017-08-02 ENCOUNTER — Encounter: Payer: Self-pay | Admitting: *Deleted

## 2017-08-10 NOTE — Discharge Instructions (Signed)

## 2017-08-15 ENCOUNTER — Ambulatory Visit: Payer: Medicare Other | Admitting: Anesthesiology

## 2017-08-15 ENCOUNTER — Ambulatory Visit
Admission: RE | Admit: 2017-08-15 | Discharge: 2017-08-15 | Disposition: A | Discharge status: Home / Self Care | Payer: Medicare Other | Source: Ambulatory Visit | Attending: Ophthalmology | Admitting: Ophthalmology

## 2017-08-15 ENCOUNTER — Encounter
Admission: RE | Disposition: A | Discharge status: Home / Self Care | Payer: Self-pay | Source: Ambulatory Visit | Attending: Ophthalmology

## 2017-08-15 DIAGNOSIS — G20 Parkinson's disease: Secondary | ICD-10-CM | POA: Diagnosis not present

## 2017-08-15 DIAGNOSIS — H2511 Age-related nuclear cataract, right eye: Secondary | ICD-10-CM | POA: Diagnosis present

## 2017-08-15 DIAGNOSIS — Z79899 Other long term (current) drug therapy: Secondary | ICD-10-CM | POA: Insufficient documentation

## 2017-08-15 DIAGNOSIS — Z7952 Long term (current) use of systemic steroids: Secondary | ICD-10-CM | POA: Insufficient documentation

## 2017-08-15 DIAGNOSIS — Z87891 Personal history of nicotine dependence: Secondary | ICD-10-CM | POA: Insufficient documentation

## 2017-08-15 DIAGNOSIS — M109 Gout, unspecified: Secondary | ICD-10-CM | POA: Diagnosis not present

## 2017-08-15 HISTORY — PX: CATARACT EXTRACTION W/PHACO: SHX586

## 2017-08-15 SURGERY — PHACOEMULSIFICATION, CATARACT, WITH IOL INSERTION
Anesthesia: Monitor Anesthesia Care | Site: Eye | Laterality: Right | Wound class: "Clean "

## 2017-08-15 MED ORDER — CEFUROXIME OPHTHALMIC INJECTION 1 MG/0.1 ML
INJECTION | OPHTHALMIC | Status: DC | PRN
  Administered 2017-08-15: 0.1 mL via INTRACAMERAL

## 2017-08-15 MED ORDER — ARMC OPHTHALMIC DILATING DROPS
1.0000 | OPHTHALMIC | Status: DC | PRN
Start: 2017-08-15 — End: 2017-08-15
  Administered 2017-08-15 (×3): 1 via OPHTHALMIC

## 2017-08-15 MED ORDER — EPINEPHRINE PF 1 MG/ML IJ SOLN
INTRAOCULAR | Status: DC | PRN
  Administered 2017-08-15: 61 mL via OPHTHALMIC

## 2017-08-15 MED ORDER — BRIMONIDINE TARTRATE-TIMOLOL 0.2-0.5 % OP SOLN
OPHTHALMIC | Status: DC | PRN
  Administered 2017-08-15: 1 [drp] via OPHTHALMIC

## 2017-08-15 MED ORDER — FENTANYL CITRATE (PF) 100 MCG/2ML IJ SOLN
INTRAMUSCULAR | Status: DC | PRN
  Administered 2017-08-15: 50 ug via INTRAVENOUS

## 2017-08-15 MED ORDER — LIDOCAINE HCL (PF) 2 % IJ SOLN
INTRAOCULAR | Status: DC | PRN
  Administered 2017-08-15: 1 mL

## 2017-08-15 MED ORDER — NA HYALUR & NA CHOND-NA HYALUR 0.4-0.35 ML IO KIT
PACK | INTRAOCULAR | Status: DC | PRN
  Administered 2017-08-15: 1 mL via INTRAOCULAR

## 2017-08-15 MED ORDER — MOXIFLOXACIN HCL 0.5 % OP SOLN
1.0000 [drp] | OPHTHALMIC | Status: DC | PRN
  Administered 2017-08-15 (×3): 1 [drp] via OPHTHALMIC

## 2017-08-15 SURGICAL SUPPLY — 20 items
CANNULA ANT/CHMB 27G (MISCELLANEOUS) ×1 IMPLANT
CANNULA ANT/CHMB 27GA (MISCELLANEOUS) ×3 IMPLANT
GLOVE SURG LX 7.5 STRW (GLOVE) ×2
GLOVE SURG LX STRL 7.5 STRW (GLOVE) ×1 IMPLANT
GLOVE SURG TRIUMPH 8.0 PF LTX (GLOVE) ×3 IMPLANT
GOWN STRL REUS W/ TWL LRG LVL3 (GOWN DISPOSABLE) ×2 IMPLANT
GOWN STRL REUS W/TWL LRG LVL3 (GOWN DISPOSABLE) ×4
LENS IOL TECNIS ITEC 22.0 (Intraocular Lens) ×2 IMPLANT
MARKER SKIN DUAL TIP RULER LAB (MISCELLANEOUS) ×3 IMPLANT
NDL FILTER BLUNT 18X1 1/2 (NEEDLE) ×1 IMPLANT
NEEDLE FILTER BLUNT 18X 1/2SAF (NEEDLE) ×2
NEEDLE FILTER BLUNT 18X1 1/2 (NEEDLE) ×1 IMPLANT
PACK CATARACT BRASINGTON (MISCELLANEOUS) ×3 IMPLANT
PACK EYE AFTER SURG (MISCELLANEOUS) ×3 IMPLANT
PACK OPTHALMIC (MISCELLANEOUS) ×3 IMPLANT
SYR 3ML LL SCALE MARK (SYRINGE) ×3 IMPLANT
SYR 5ML LL (SYRINGE) ×3 IMPLANT
SYR TB 1ML LUER SLIP (SYRINGE) ×3 IMPLANT
WATER STERILE IRR 500ML POUR (IV SOLUTION) ×3 IMPLANT
WIPE NON LINTING 3.25X3.25 (MISCELLANEOUS) ×3 IMPLANT

## 2017-08-15 NOTE — Transfer of Care (Signed)
Immediate Anesthesia Transfer of Care Note  Patient: Darrell Fry  Procedure(s) Performed: CATARACT EXTRACTION PHACO AND INTRAOCULAR LENS PLACEMENT (IOC) RIGHT (Right Eye)  Patient Location: PACU  Anesthesia Type: MAC  Level of Consciousness: awake, alert  and patient cooperative  Airway and Oxygen Therapy: Patient Spontanous Breathing and Patient connected to supplemental oxygen  Post-op Assessment: Post-op Vital signs reviewed, Patient's Cardiovascular Status Stable, Respiratory Function Stable, Patent Airway and No signs of Nausea or vomiting  Post-op Vital Signs: Reviewed and stable  Complications: No apparent anesthesia complications

## 2017-08-15 NOTE — Op Note (Signed)
LOCATION:  [facility]   PREOPERATIVE DIAGNOSIS:    Nuclear sclerotic cataract right eye. H25.11   POSTOPERATIVE DIAGNOSIS:  Nuclear sclerotic cataract right eye.     PROCEDURE:  Phacoemusification with posterior chamber intraocular lens placement of the right eye   LENS:   Implant Name Type Inv. Item Serial No. Manufacturer Lot No. LRB No. Used  LENS IOL DIOP 22.0 - [id number] Intraocular Lens LENS IOL DIOP 22.0 [id number]  Right 1  LENS IOL DIOP 22.0 - [id number] Intraocular Lens LENS IOL DIOP 22.0 [id number] AMO  Right 1        ULTRASOUND TIME: 19 % of 1 minutes, 12 seconds.  CDE 14.0   SURGEON:  Deirdre Evenerhadwick R. Afton Lavalle, MD   ANESTHESIA:  Topical with tetracaine drops and 2% Xylocaine jelly, augmented with 1% preservative-free intracameral lidocaine.    COMPLICATIONS:  None.   DESCRIPTION OF PROCEDURE:  The patient was identified in the holding room and transported to the operating room and placed in the supine position under the operating microscope.  The right eye was identified as the operative eye and it was prepped and draped in the usual sterile ophthalmic fashion.   A 1 millimeter clear-corneal paracentesis was made at the 12:00 position.  0.5 ml of preservative-free 1% lidocaine was injected into the anterior chamber. The anterior chamber was filled with Viscoat viscoelastic.  A 2.4 millimeter keratome was used to make a near-clear corneal incision at the 9:00 position.  A curvilinear capsulorrhexis was made with a cystotome and capsulorrhexis forceps.  Balanced salt solution was used to hydrodissect and hydrodelineate the nucleus.   Phacoemulsification was then used in stop and chop fashion to remove the lens nucleus and epinucleus.  The remaining cortex was then removed using the irrigation and aspiration handpiece. Provisc was then placed into the capsular bag to distend it for lens placement.  A lens was then injected into the capsular bag.  The remaining  viscoelastic was aspirated.   Wounds were hydrated with balanced salt solution.  The anterior chamber was inflated to a physiologic pressure with balanced salt solution.  No wound leaks were noted. Cefuroxime 0.1 ml of a 10mg /ml solution was injected into the anterior chamber for a dose of 1 mg of intracameral antibiotic at the completion of the case.   Timolol and Brimonidine drops were applied to the eye.  The patient was taken to the recovery room in stable condition without complications of anesthesia or surgery.   Akansha Wyche 08/15/2017, 9:47 AM

## 2017-08-15 NOTE — H&P (Signed)
The History and Physical notes are on paper, have been signed, and are to be scanned. The patient remains stable and unchanged from the H&P.   Previous H&P reviewed, patient examined, and there are no changes.  Darrell Fry 08/15/2017 8:59 AM

## 2017-08-15 NOTE — Anesthesia Preprocedure Evaluation (Addendum)
Anesthesia Evaluation  Patient identified by MRN, date of birth, ID band Patient awake    Reviewed: Allergy & Precautions, NPO status , Patient's Chart, lab work & pertinent test results, reviewed documented beta blocker date and time   Airway Mallampati: II  TM Distance: >3 FB Neck ROM: Full    Dental no notable dental hx.    Pulmonary former smoker,    Pulmonary exam normal breath sounds clear to auscultation       Cardiovascular negative cardio ROS Normal cardiovascular exam Rhythm:Regular Rate:Normal     Neuro/Psych Parkinson's negative psych ROS   GI/Hepatic negative GI ROS, Neg liver ROS,   Endo/Other  negative endocrine ROS  Renal/GU negative Renal ROS  negative genitourinary   Musculoskeletal  (+) Arthritis , Gout   Abdominal Normal abdominal exam  (+)   Peds  Hematology negative hematology ROS (+)   Anesthesia Other Findings   Reproductive/Obstetrics                            Anesthesia Physical Anesthesia Plan  ASA: II  Anesthesia Plan: MAC   Post-op Pain Management:    Induction: Intravenous  PONV Risk Score and Plan:   Airway Management Planned: Natural Airway  Additional Equipment: None  Intra-op Plan:   Post-operative Plan:   Informed Consent: I have reviewed the patients History and Physical, chart, labs and discussed the procedure including the risks, benefits and alternatives for the proposed anesthesia with the patient or authorized representative who has indicated his/her understanding and acceptance.     Plan Discussed with: CRNA, Anesthesiologist and Surgeon  Anesthesia Plan Comments:         Anesthesia Quick Evaluation

## 2017-08-15 NOTE — Anesthesia Postprocedure Evaluation (Signed)
Anesthesia Post Note  Patient: Darrell Fry  Procedure(s) Performed: CATARACT EXTRACTION PHACO AND INTRAOCULAR LENS PLACEMENT (IOC) RIGHT (Right Eye)  Patient location during evaluation: PACU Anesthesia Type: MAC Level of consciousness: awake Pain management: pain level controlled Vital Signs Assessment: post-procedure vital signs reviewed and stable Respiratory status: spontaneous breathing Cardiovascular status: blood pressure returned to baseline Postop Assessment: no headache Anesthetic complications: no    Lavonna Monarch

## 2017-08-15 NOTE — Anesthesia Procedure Notes (Signed)
Procedure Name: MAC Performed by: Rachelann Enloe, CRNA Pre-anesthesia Checklist: Patient identified, Emergency Drugs available, Suction available, Timeout performed and Patient being monitored Patient Re-evaluated:Patient Re-evaluated prior to induction Oxygen Delivery Method: Nasal cannula Placement Confirmation: positive ETCO2       

## 2017-08-16 ENCOUNTER — Encounter: Payer: Self-pay | Admitting: Ophthalmology

## 2018-03-04 ENCOUNTER — Other Ambulatory Visit: Payer: Self-pay

## 2018-03-04 ENCOUNTER — Emergency Department: Payer: Medicare Other

## 2018-03-04 ENCOUNTER — Emergency Department
Admission: EM | Admit: 2018-03-04 | Discharge: 2018-03-04 | Disposition: A | Discharge status: Home / Self Care | Payer: Medicare Other | Attending: Emergency Medicine | Admitting: Emergency Medicine

## 2018-03-04 ENCOUNTER — Encounter: Payer: Self-pay | Admitting: Emergency Medicine

## 2018-03-04 DIAGNOSIS — R531 Weakness: Secondary | ICD-10-CM | POA: Insufficient documentation

## 2018-03-04 DIAGNOSIS — R4789 Other speech disturbances: Secondary | ICD-10-CM | POA: Diagnosis not present

## 2018-03-04 DIAGNOSIS — G20 Parkinson's disease: Secondary | ICD-10-CM | POA: Insufficient documentation

## 2018-03-04 DIAGNOSIS — R109 Unspecified abdominal pain: Secondary | ICD-10-CM | POA: Insufficient documentation

## 2018-03-04 DIAGNOSIS — Z87891 Personal history of nicotine dependence: Secondary | ICD-10-CM | POA: Insufficient documentation

## 2018-03-04 DIAGNOSIS — R4182 Altered mental status, unspecified: Secondary | ICD-10-CM | POA: Diagnosis present

## 2018-03-04 LAB — CBC
HCT: 38.1 % — ABNORMAL LOW (ref 39.0–52.0)
Hemoglobin: 12.6 g/dL — ABNORMAL LOW (ref 13.0–17.0)
MCH: 31.7 pg (ref 26.0–34.0)
MCHC: 33.1 g/dL (ref 30.0–36.0)
MCV: 95.7 fL (ref 80.0–100.0)
Platelets: 168 10*3/uL (ref 150–400)
RBC: 3.98 MIL/uL — ABNORMAL LOW (ref 4.22–5.81)
RDW: 13.7 % (ref 11.5–15.5)
WBC: 11.2 10*3/uL — ABNORMAL HIGH (ref 4.0–10.5)
nRBC: 0 % (ref 0.0–0.2)

## 2018-03-04 LAB — COMPREHENSIVE METABOLIC PANEL
ALT: 36 U/L (ref 0–44)
AST: 41 U/L (ref 15–41)
Albumin: 4.3 g/dL (ref 3.5–5.0)
Alkaline Phosphatase: 94 U/L (ref 38–126)
Anion gap: 10 (ref 5–15)
BUN: 24 mg/dL — AB (ref 8–23)
CO2: 24 mmol/L (ref 22–32)
Calcium: 11.4 mg/dL — ABNORMAL HIGH (ref 8.9–10.3)
Chloride: 104 mmol/L (ref 98–111)
Creatinine, Ser: 0.82 mg/dL (ref 0.61–1.24)
GFR calc Af Amer: >60 mL/min (ref >60)
GFR calc non Af Amer: >60 mL/min (ref >60)
Glucose, Bld: 112 mg/dL — ABNORMAL HIGH (ref 70–99)
Potassium: 4 mmol/L (ref 3.5–5.1)
Sodium: 138 mmol/L (ref 135–145)
Total Bilirubin: 0.7 mg/dL (ref 0.3–1.2)
Total Protein: 7.2 g/dL (ref 6.5–8.1)

## 2018-03-04 LAB — PROTIME-INR
INR: 1 (ref 0.8–1.2)
Prothrombin Time: 13.5 seconds (ref 11.4–15.2)

## 2018-03-04 LAB — DIFFERENTIAL
Abs Immature Granulocytes: 0.19 10*3/uL — ABNORMAL HIGH (ref 0.00–0.07)
BASOS PCT: 1 %
Basophils Absolute: 0.1 10*3/uL (ref 0.0–0.1)
Eosinophils Absolute: 0.2 10*3/uL (ref 0.0–0.5)
Eosinophils Relative: 2 %
Immature Granulocytes: 2 %
LYMPHS ABS: 0.8 10*3/uL (ref 0.7–4.0)
Lymphocytes Relative: 7 %
MONOS PCT: 9 %
Monocytes Absolute: 1 10*3/uL (ref 0.1–1.0)
Neutro Abs: 8.9 10*3/uL — ABNORMAL HIGH (ref 1.7–7.7)
Neutrophils Relative %: 79 %

## 2018-03-04 LAB — APTT: aPTT: 30 seconds (ref 24–36)

## 2018-03-04 LAB — GLUCOSE, CAPILLARY: Glucose-Capillary: 194 mg/dL — ABNORMAL HIGH (ref 70–99)

## 2018-03-04 NOTE — ED Triage Notes (Signed)
[facility] MD called and reported pt was coming over to rule out stroke.  MD reports pt symptoms started at 7am and had some dysarthria in the clinic. Pt wife reports pt sx's started last Monday. Pt wife reports pt has hx o parkinsons and sometimes has difficulty speaking but it was worse today.

## 2018-03-04 NOTE — ED Notes (Signed)
Patient transported to MRI 

## 2018-03-04 NOTE — ED Triage Notes (Signed)
PT sent by PCP for changes in mental status and dysarthria. Per spouse noticed pt was having difficulty speaking upon awakening this am around 0700. Pt is A&O to self but having diffculity with speech,. PT has RT arm weakness noted . VSS

## 2018-03-04 NOTE — ED Notes (Signed)
Spouse states he started having decrease ambulation last week as well , unknown of last known well time

## 2018-03-04 NOTE — ED Provider Notes (Signed)
[facility] Emergency Department Provider Note       Time seen: ----------------------------------------- 3:31 PM on 03/04/2018 -----------------------------------------   I have reviewed the triage vital signs and the nursing notes.  HISTORY   Chief Complaint No chief complaint on file.    HPI Darrell Fry is a 85 y.o. male with a history of arthritis, Parkinson's disease who presents to the ED for mental status changes and dysarthria.  Spouse noted he was having difficulty speaking upon waking this morning at 7:00.  He arrives alert and oriented but has had some speech trouble which she states has happened in the past.  Family denies any other neurologic complaints.  Past Medical History:  Diagnosis Date  . Arthritis    knees  . Parkinson's disease (HCC)    no meds  . Wears hearing aid in both ears     There are no active problems to display for this patient.   Past Surgical History:  Procedure Laterality Date  . CATARACT EXTRACTION W/PHACO Left 07/18/2017   Procedure: CATARACT EXTRACTION PHACO AND INTRAOCULAR LENS PLACEMENT (IOC) IVA TOPICAL  LEFT;  Surgeon: Lockie Mola, MD;  Location: [facility];  Service: Ophthalmology;  Laterality: Left;  . CATARACT EXTRACTION W/PHACO Right 08/15/2017   Procedure: CATARACT EXTRACTION PHACO AND INTRAOCULAR LENS PLACEMENT (IOC) RIGHT;  Surgeon: Lockie Mola, MD;  Location: [facility];  Service: Ophthalmology;  Laterality: Right;  . COLONOSCOPY    . FINGER SURGERY     no anesthesia    Allergies Patient has no known allergies.  Social History Social History   Tobacco Use  . Smoking status: Former Games developer  . Smokeless tobacco: Never Used  . Tobacco comment: quit in 1970s  Substance Use Topics  . Alcohol use: Yes    Alcohol/week: 5.0 standard drinks    Types: 5 Cans of beer per week  . Drug use: Not on file   Review of Systems Constitutional: Negative for  fever. Cardiovascular: Negative for chest pain. Respiratory: Negative for shortness of breath. Gastrointestinal: Negative for abdominal pain, vomiting and diarrhea. Musculoskeletal: Negative for back pain. Skin: Negative for rash. Neurological: Positive for mental status changes, dysarthria  All systems negative/normal/unremarkable except as stated in the HPI  ____________________________________________   PHYSICAL EXAM:  VITAL SIGNS: ED Triage Vitals  Enc Vitals Group     BP 03/04/18 1132 (!) 116/53     Pulse Rate 03/04/18 1132 69     Resp 03/04/18 1132 16     Temp 03/04/18 1132 98.2 F (36.8 C)     Temp Source 03/04/18 1442 Oral     SpO2 03/04/18 1132 98 %     Weight --      Height --      Head Circumference --      Peak Flow --      Pain Score 03/04/18 1133 0     Pain Loc --      Pain Edu? --      Excl. in GC? --    Constitutional: Alert and oriented. Well appearing and in no distress. Eyes: Conjunctivae are normal. Normal extraocular movements. ENT      Head: Normocephalic and atraumatic.      Nose: No congestion/rhinnorhea.      Mouth/Throat: Mucous membranes are moist.      Neck: No stridor. Cardiovascular: Normal rate, regular rhythm. No murmurs, rubs, or gallops. Respiratory: Normal respiratory effort without tachypnea nor retractions. Breath sounds are clear and equal bilaterally. No  wheezes/rales/rhonchi. Gastrointestinal: Soft and nontender. Normal bowel sounds Musculoskeletal: Nontender with normal range of motion in extremities. No lower extremity tenderness nor edema. Neurologic:  No gross focal neurologic deficits are appreciated.  Some bradykinesia is noted.  No gross acute neurologic deficits are noted Skin:  Skin is warm, dry and intact. No rash noted. Psychiatric: Mood and affect are normal. Speech and behavior are normal.  ____________________________________________  EKG: Interpreted by me.  Sinus rhythm the rate of 70 bpm, right bundle branch  block, normal axis, normal QT  ____________________________________________  ED COURSE:  As part of my medical decision making, I reviewed the following data within the electronic MEDICAL RECORD NUMBER History obtained from family if available, nursing notes, old chart and ekg, as well as notes from prior ED visits. Patient presented for mental status changes and difficulty speaking, we will assess with labs and imaging as indicated at this time. Clinical Course as of Mar 03 1825  Mon Mar 04, 2018  1724 Family states his abdomen has been distended.  I will order 2 view abdomen.   [JW]    Clinical Course User Index [JW] Emily Filbert, MD   Procedures ____________________________________________   LABS (pertinent positives/negatives)  Labs Reviewed  CBC - Abnormal; Notable for the following components:      Result Value   WBC 11.2 (*)    RBC 3.98 (*)    Hemoglobin 12.6 (*)    HCT 38.1 (*)    All other components within normal limits  DIFFERENTIAL - Abnormal; Notable for the following components:   Neutro Abs 8.9 (*)    Abs Immature Granulocytes 0.19 (*)    All other components within normal limits  COMPREHENSIVE METABOLIC PANEL - Abnormal; Notable for the following components:   Glucose, Bld 112 (*)    BUN 24 (*)    Calcium 11.4 (*)    All other components within normal limits  PROTIME-INR  APTT  I-STAT CREATININE, ED  CBG MONITORING, ED    RADIOLOGY  CT head is unremarkable Brain MRI IMPRESSION: 1. No acute intracranial process. 2. Increased number chronic microhemorrhages associated with amyloid angiopathy. 3. Mild chronic small vessel ischemic changes moderate to severe parenchymal brain volume loss.  ____________________________________________   DIFFERENTIAL DIAGNOSIS   Parkinson's disease, CVA, TIA, dehydration, electrolyte abnormality, occult infection  FINAL ASSESSMENT AND PLAN  Parkinson's disease, weakness   Plan: The patient had presented  for mental status changes and difficulty speaking. Patient's labs were reassuring. Patient's imaging revealed age-related changes and progression of expected changes related to Parkinson's disease.  Case was reviewed with a neurologist.  He is cleared for outpatient follow-up with neurology.  Family had also concerns about some abdominal pain he was having.  His abdomen feels soft with normal two-view x-ray.  Have advised outpatient follow-up for this as well.   Ulice Dash, MD    Note: This note was generated in part or whole with voice recognition software. Voice recognition is usually quite accurate but there are transcription errors that can and very often do occur. I apologize for any typographical errors that were not detected and corrected.     Emily Filbert, MD 03/04/18 [phone]

## 2018-03-05 ENCOUNTER — Observation Stay
Admission: EM | Admit: 2018-03-05 | Discharge: 2018-03-06 | Disposition: A | Discharge status: Skilled Nursing Facility | Payer: Medicare Other | Attending: Internal Medicine | Admitting: Internal Medicine

## 2018-03-05 ENCOUNTER — Encounter: Payer: Self-pay | Admitting: Internal Medicine

## 2018-03-05 ENCOUNTER — Observation Stay: Payer: Medicare Other

## 2018-03-05 ENCOUNTER — Other Ambulatory Visit: Payer: Self-pay

## 2018-03-05 DIAGNOSIS — Z79899 Other long term (current) drug therapy: Secondary | ICD-10-CM | POA: Insufficient documentation

## 2018-03-05 DIAGNOSIS — Z87891 Personal history of nicotine dependence: Secondary | ICD-10-CM | POA: Diagnosis not present

## 2018-03-05 DIAGNOSIS — D649 Anemia, unspecified: Secondary | ICD-10-CM | POA: Insufficient documentation

## 2018-03-05 DIAGNOSIS — F0280 Dementia in other diseases classified elsewhere without behavioral disturbance: Secondary | ICD-10-CM | POA: Diagnosis not present

## 2018-03-05 DIAGNOSIS — R4182 Altered mental status, unspecified: Secondary | ICD-10-CM

## 2018-03-05 DIAGNOSIS — G9340 Encephalopathy, unspecified: Secondary | ICD-10-CM | POA: Diagnosis not present

## 2018-03-05 DIAGNOSIS — E860 Dehydration: Secondary | ICD-10-CM | POA: Insufficient documentation

## 2018-03-05 DIAGNOSIS — M109 Gout, unspecified: Secondary | ICD-10-CM | POA: Diagnosis not present

## 2018-03-05 DIAGNOSIS — R3129 Other microscopic hematuria: Secondary | ICD-10-CM | POA: Diagnosis not present

## 2018-03-05 DIAGNOSIS — N400 Enlarged prostate without lower urinary tract symptoms: Secondary | ICD-10-CM | POA: Insufficient documentation

## 2018-03-05 DIAGNOSIS — G20 Parkinson's disease: Secondary | ICD-10-CM | POA: Diagnosis not present

## 2018-03-05 DIAGNOSIS — I680 Cerebral amyloid angiopathy: Secondary | ICD-10-CM | POA: Diagnosis not present

## 2018-03-05 HISTORY — DX: Dementia in other diseases classified elsewhere, unspecified severity, without behavioral disturbance, psychotic disturbance, mood disturbance, and anxiety: F02.80

## 2018-03-05 HISTORY — DX: Parkinson's disease: G20

## 2018-03-05 HISTORY — DX: Dementia in other diseases classified elsewhere, unspecified severity, without behavioral disturbance, psychotic disturbance, mood disturbance, and anxiety: G20.A1

## 2018-03-05 HISTORY — DX: Organ-limited amyloidosis: E85.4

## 2018-03-05 HISTORY — DX: REM sleep behavior disorder: G47.52

## 2018-03-05 HISTORY — DX: Cerebral amyloid angiopathy: I68.0

## 2018-03-05 LAB — COMPREHENSIVE METABOLIC PANEL
ALT: 34 U/L (ref 0–44)
AST: 37 U/L (ref 15–41)
Albumin: 3.8 g/dL (ref 3.5–5.0)
Alkaline Phosphatase: 99 U/L (ref 38–126)
Anion gap: 8 (ref 5–15)
BUN: 26 mg/dL — ABNORMAL HIGH (ref 8–23)
CO2: 27 mmol/L (ref 22–32)
Calcium: 11.4 mg/dL — ABNORMAL HIGH (ref 8.9–10.3)
Chloride: 105 mmol/L (ref 98–111)
Creatinine, Ser: 0.73 mg/dL (ref 0.61–1.24)
GFR calc Af Amer: >60 mL/min (ref >60)
GFR calc non Af Amer: >60 mL/min (ref >60)
Glucose, Bld: 106 mg/dL — ABNORMAL HIGH (ref 70–99)
Potassium: 4 mmol/L (ref 3.5–5.1)
Sodium: 140 mmol/L (ref 135–145)
Total Bilirubin: 0.8 mg/dL (ref 0.3–1.2)
Total Protein: 6.8 g/dL (ref 6.5–8.1)

## 2018-03-05 LAB — URINALYSIS, COMPLETE (UACMP) WITH MICROSCOPIC
Bilirubin Urine: NEGATIVE
Glucose, UA: NEGATIVE mg/dL
Ketones, ur: NEGATIVE mg/dL
Leukocytes,Ua: NEGATIVE
Nitrite: NEGATIVE
Protein, ur: 30 mg/dL — AB
RBC / HPF: >50 RBC/hpf — ABNORMAL HIGH (ref 0–5)
SPECIFIC GRAVITY, URINE: 1.02 (ref 1.005–1.030)
pH: 6 (ref 5.0–8.0)

## 2018-03-05 LAB — CBC
HCT: 36.6 % — ABNORMAL LOW (ref 39.0–52.0)
Hemoglobin: 12.1 g/dL — ABNORMAL LOW (ref 13.0–17.0)
MCH: 31.8 pg (ref 26.0–34.0)
MCHC: 33.1 g/dL (ref 30.0–36.0)
MCV: 96.3 fL (ref 80.0–100.0)
Platelets: 144 10*3/uL — ABNORMAL LOW (ref 150–400)
RBC: 3.8 MIL/uL — ABNORMAL LOW (ref 4.22–5.81)
RDW: 13.9 % (ref 11.5–15.5)
WBC: 9.6 10*3/uL (ref 4.0–10.5)
nRBC: 0 % (ref 0.0–0.2)

## 2018-03-05 LAB — RAPID HIV SCREEN (HIV 1/2 AB+AG)
HIV 1/2 Antibodies: NONREACTIVE
HIV-1 P24 Antigen - HIV24: NONREACTIVE

## 2018-03-05 LAB — MAGNESIUM: Magnesium: 2.1 mg/dL (ref 1.7–2.4)

## 2018-03-05 LAB — PROCALCITONIN: Procalcitonin: <0.1 ng/mL

## 2018-03-05 LAB — PHOSPHORUS: Phosphorus: 2.4 mg/dL — ABNORMAL LOW (ref 2.5–4.6)

## 2018-03-05 LAB — TSH: TSH: 1.785 u[IU]/mL (ref 0.350–4.500)

## 2018-03-05 LAB — AMMONIA: AMMONIA: 30 umol/L (ref 9–35)

## 2018-03-05 LAB — VITAMIN B12: Vitamin B-12: 236 pg/mL (ref 180–914)

## 2018-03-05 LAB — FOLATE: Folate: 20.3 ng/mL (ref >5.9)

## 2018-03-05 MED ORDER — TIMOLOL HEMIHYDRATE 0.25 % OP SOLN
1.0000 [drp] | Freq: Every day | OPHTHALMIC | Status: DC
  Filled 2018-03-05 (×3): qty 5

## 2018-03-05 MED ORDER — ALLOPURINOL 100 MG PO TABS: 100.0000 mg | ORAL_TABLET | Freq: Every day | ORAL | Status: DC

## 2018-03-05 MED ORDER — SENNOSIDES-DOCUSATE SODIUM 8.6-50 MG PO TABS: 1.0000 | ORAL_TABLET | Freq: Every evening | ORAL | Status: DC | PRN

## 2018-03-05 MED ORDER — ALLOPURINOL 300 MG PO TABS
300.0000 mg | ORAL_TABLET | Freq: Every day | ORAL | Status: DC
  Administered 2018-03-05 – 2018-03-06 (×2): 300 mg via ORAL
  Filled 2018-03-05: qty 3
  Filled 2018-03-05 (×2): qty 1

## 2018-03-05 MED ORDER — LACTATED RINGERS IV SOLN
INTRAVENOUS | Status: DC
  Administered 2018-03-05: 05:00:00 via INTRAVENOUS

## 2018-03-05 MED ORDER — CARBIDOPA-LEVODOPA ER 25-100 MG PO TBCR
1.0000 | EXTENDED_RELEASE_TABLET | Freq: Two times a day (BID) | ORAL | Status: DC
  Administered 2018-03-05: 1 via ORAL
  Filled 2018-03-05 (×4): qty 1

## 2018-03-05 MED ORDER — ENOXAPARIN SODIUM 40 MG/0.4ML ~~LOC~~ SOLN
40.0000 mg | SUBCUTANEOUS | Status: DC
  Administered 2018-03-06: 40 mg via SUBCUTANEOUS
  Filled 2018-03-05: qty 0.4

## 2018-03-05 MED ORDER — ACETAMINOPHEN 325 MG PO TABS: 650.0000 mg | ORAL_TABLET | Freq: Four times a day (QID) | ORAL | Status: DC | PRN

## 2018-03-05 MED ORDER — TAMSULOSIN HCL 0.4 MG PO CAPS
0.4000 mg | ORAL_CAPSULE | Freq: Every day | ORAL | Status: DC
  Administered 2018-03-05 – 2018-03-06 (×2): 0.4 mg via ORAL
  Filled 2018-03-05 (×2): qty 1

## 2018-03-05 MED ORDER — SODIUM CHLORIDE 0.9 % IV SOLN
1.0000 g | INTRAVENOUS | Status: DC
  Administered 2018-03-05: 07:00:00 1 g via INTRAVENOUS
  Filled 2018-03-05: qty 1

## 2018-03-05 MED ORDER — ACETAMINOPHEN 650 MG RE SUPP: 650.0000 mg | Freq: Four times a day (QID) | RECTAL | Status: DC | PRN

## 2018-03-05 MED ORDER — ORAL CARE MOUTH RINSE
15.0000 mL | Freq: Two times a day (BID) | OROMUCOSAL | Status: DC
  Administered 2018-03-06 (×2): 15 mL via OROMUCOSAL

## 2018-03-05 MED ORDER — ONDANSETRON HCL 4 MG/2ML IJ SOLN: 4.0000 mg | Freq: Four times a day (QID) | INTRAMUSCULAR | Status: DC | PRN

## 2018-03-05 MED ORDER — ONDANSETRON HCL 4 MG PO TABS: 4.0000 mg | ORAL_TABLET | Freq: Four times a day (QID) | ORAL | Status: DC | PRN

## 2018-03-05 MED ORDER — BISACODYL 5 MG PO TBEC: 5.0000 mg | DELAYED_RELEASE_TABLET | Freq: Every day | ORAL | Status: DC | PRN

## 2018-03-05 NOTE — Progress Notes (Addendum)
[employer] - [place] [facility]   PATIENT NAME: Darrell Fry    MR#:  [id number]  DATE OF BIRTH:  01-20-1927  SUBJECTIVE:  CHIEF COMPLAINT:   Patient is poor historian due to chronic Parkinson's disease, wife at the bedside, case discussed with neurology with all questions answered-there will be a trial change in his Parkinson's medications, PT/OT/speech therapy to see  REVIEW OF SYSTEMS:  CONSTITUTIONAL: No fever, fatigue or weakness.  EYES: No blurred or double vision.  EARS, NOSE, AND THROAT: No tinnitus or ear pain.  RESPIRATORY: No cough, shortness of breath, wheezing or hemoptysis.  CARDIOVASCULAR: No chest pain, orthopnea, edema.  GASTROINTESTINAL: No nausea, vomiting, diarrhea or abdominal pain.  GENITOURINARY: No dysuria, hematuria.  ENDOCRINE: No polyuria, nocturia,  HEMATOLOGY: No anemia, easy bruising or bleeding SKIN: No rash or lesion. MUSCULOSKELETAL: No joint pain or arthritis.   NEUROLOGIC: No tingling, numbness, weakness.  PSYCHIATRY: No anxiety or depression.   ROS  DRUG ALLERGIES:  No Known Allergies  VITALS:  Blood pressure (!) 145/67, pulse 74, temperature 98.5 F (36.9 C), temperature source Oral, resp. rate 18, height 5\' 5"  (1.651 m), weight 70.6 kg, SpO2 95 %.  PHYSICAL EXAMINATION:  GENERAL:  85 y.o.-year-old patient lying in the bed with no acute distress.  EYES: Pupils equal, round, reactive to light and accommodation. No scleral icterus. Extraocular muscles intact.  HEENT: Head atraumatic, normocephalic. Oropharynx and nasopharynx clear.  NECK:  Supple, no jugular venous distention. No thyroid enlargement, no tenderness.  LUNGS: Normal breath sounds bilaterally, no wheezing, rales,rhonchi or crepitation. No use of accessory muscles of respiration.  CARDIOVASCULAR: S1, S2 normal. No murmurs, rubs, or gallops.  ABDOMEN: Soft, nontender, nondistended. Bowel sounds present. No organomegaly or mass.  EXTREMITIES: No pedal edema,  cyanosis, or clubbing.  NEUROLOGIC: Cranial nerves II through XII are intact. Muscle strength 5/5 in all extremities. Sensation intact. Gait not checked.  PSYCHIATRIC: The patient is alert and oriented x 3.  SKIN: No obvious rash, lesion, or ulcer.   Physical Exam LABORATORY PANEL:   CBC Recent Labs  Lab 03/05/18 0058  WBC 9.6  HGB 12.1*  HCT 36.6*  PLT 144*   ------------------------------------------------------------------------------------------------------------------  Chemistries  Recent Labs  Lab 03/05/18 0058 03/05/18 0422  NA 140  --   K 4.0  --   CL 105  --   CO2 27  --   GLUCOSE 106*  --   BUN 26*  --   CREATININE 0.73  --   CALCIUM 11.4*  --   MG  --  2.1  AST 37  --   ALT 34  --   ALKPHOS 99  --   BILITOT 0.8  --    ------------------------------------------------------------------------------------------------------------------  Cardiac Enzymes No results for input(s): TROPONINI in the last 168 hours. ------------------------------------------------------------------------------------------------------------------  RADIOLOGY:  Ct Head Wo Contrast  Result Date: 03/04/2018 CLINICAL DATA:  Speech difficulty.  Possible stroke. EXAM: CT HEAD WITHOUT CONTRAST TECHNIQUE: Contiguous axial images were obtained from the base of the skull through the vertex without intravenous contrast. COMPARISON:  MRI 09/27/2017 FINDINGS: Brain: No acute intracranial abnormality. Specifically, no hemorrhage, hydrocephalus, mass lesion, acute infarction, or significant intracranial injury. Vascular: No hyperdense vessel or unexpected calcification. Skull: No acute calvarial abnormality. Sinuses/Orbits: Visualized paranasal sinuses and mastoids clear. Orbital soft tissues unremarkable. Other: none IMPRESSION: Atrophy, chronic microvascular disease. No acute intracranial abnormality. Electronically Signed   By: Charlett Nose M.D.   On: 03/04/2018 12:05   Mr Brain Wo Contrast  Result  Date: 03/04/2018 CLINICAL DATA:  Slurred speech and altered mental status today. History of Parkinson's disease. EXAM: MRI HEAD WITHOUT CONTRAST TECHNIQUE: Multiplanar, multiecho pulse sequences of the brain and surrounding structures were obtained without intravenous contrast. COMPARISON:  CT HEAD March 04, 2018 and MRI of the head September 27, 2016 FINDINGS: INTRACRANIAL CONTENTS: No reduced diffusion to suggest acute ischemia. Increased number of supra and infratentorial chronic microhemorrhages with disproportion and posterior distribution. Moderate to severe parenchymal brain volume loss. No hydrocephalus. Patchy supratentorial white matter FLAIR T2 hyperintensities. No midline shift, mass effect or masses. No abnormal extra-axial fluid collections. VASCULAR: Normal major intracranial vascular flow voids present at skull base. SKULL AND UPPER CERVICAL SPINE: No abnormal sellar expansion. 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 No suspicious calvarial bone marrow signal. Craniocervical junction maintained. SINUSES/ORBITS: Mild paranasal sinus mucosal thickening. Mastoid air cells are well aerated. Status post bilateral ocular lens implants.The included ocular globes and orbital contents are non-suspicious. OTHER: None. IMPRESSION: 1. No acute intracranial process. 2. Increased number chronic microhemorrhages associated with amyloid angiopathy. 3. Mild chronic small vessel ischemic changes moderate to severe parenchymal brain volume loss. Electronically Signed   By: Awilda Metro M.D.   On: 03/04/2018 17:01   US Renal  Result Date: 03/05/2018 CLINICAL DATA:  85 year old presenting with microscopic hematuria. EXAM: RENAL / URINARY TRACT ULTRASOUND COMPLETE COMPARISON:  None. FINDINGS: Right Kidney: Renal measurements: Approximately 11.3 x 5.7 x 7.2 cm = volume: 245 mL. Well-preserved cortex for patient age. Normal parenchymal echotexture. No hydronephrosis. Approximate 6.9 x 8.2 x 7.9 cm  benign cyst arising from the mid kidney. No visible solid renal masses. Left Kidney: Renal measurements: Approximately 12.0 x 6.0 x 5.6 cm = volume: 211 mL. Well-preserved cortex for patient age. Normal parenchymal echotexture. No hydronephrosis. Approximate 4.0 x 2.4 x 2.2 cm benign cyst arising from the mid to upper kidney. No visible solid renal masses. Bladder: Approximate 2.1 x 2.0 x 2.2 cm mass posteriorly within the bladder IMPRESSION: 1. Benign cysts involving both kidneys. No significant abnormalities involving either kidney. 2. Possible 2 cm mass involving the POSTERIOR wall of the urinary bladder (favored over a small hematoma in the dependent portion of the bladder). Electronically Signed   By: Hulan Saas M.D.   On: 03/05/2018 12:01   Dg Abd 2 Views  Result Date: 03/04/2018 CLINICAL DATA:  Diarrhea. EXAM: ABDOMEN - 2 VIEW COMPARISON:  None. FINDINGS: Normal bowel gas pattern without free peritoneal air. Arterial calcifications in the left upper quadrant of the abdomen. Left pelvic phleboliths. Extensive lumbar and lower thoracic spine degenerative changes with mild dextroconvex scoliosis. IMPRESSION: No acute abnormality. Electronically Signed   By: Beckie Salts M.D.   On: 03/04/2018 18:05    ASSESSMENT AND PLAN:  *Acute on chronic worsening Parkinson's disease Case discussed with neurology-we will make medication changes to his Sinemet dosing, PT/OT/speech therapy to evaluate/treat, case management/social work to assist with disposition planning  *Chronic gout Continue allopurinol  *History of osteoarthritis Stable Tylenol as needed  *Chronic BPH Stable Flomax daily  Disposition to assisted living facility in 1 to 2 days barring complications   All the records are reviewed and case discussed with Care Management/Social Workerr. Management plans discussed with the patient, family and they are in agreement.  CODE STATUS: full  TOTAL TIME TAKING CARE OF THIS PATIENT: 40  minutes.     POSSIBLE D/C IN 1-2 DAYS, DEPENDING ON CLINICAL CONDITION.   Evelena Asa Jannie Doyle M.D on  03/05/2018   Between 7am to 6pm - Pager - [phone]  After 6pm go to [email or web address] - password [email or web address]  [facility]  Office  [phone]  CC: Primary care physician; Marisue Ivan, MD  Note: This dictation was prepared with Dragon dictation along with smaller phrase technology. Any transcriptional errors that result from this process are unintentional.

## 2018-03-05 NOTE — Progress Notes (Signed)
Notified Dr Renae Gloss. Pt bladder scan . New orders for I and O cath

## 2018-03-05 NOTE — ED Triage Notes (Signed)
Pt seen in the ED earlier today and discharged back to [facility] with dx of weakness and parkinsons. Wife states he has become more confused and weak after being discharged.

## 2018-03-05 NOTE — Progress Notes (Signed)
No floor mats available  low bed ordered

## 2018-03-05 NOTE — H&P (Addendum)
[employer] [facility] [place] at [facility]   PATIENT NAME: Darrell Fry    MR#:  [id number]  DATE OF BIRTH:  [birth year]  DATE OF ADMISSION:  03/05/2018  PRIMARY CARE PHYSICIAN: Marisue Ivan, MD   REQUESTING/REFERRING PHYSICIAN: Darci Current, MD  CHIEF COMPLAINT:   Chief Complaint  Patient presents with  . Altered Mental Status    HISTORY OF PRESENT ILLNESS:  Darrell Fry  is a 85 y.o. male with a known history of Parkinson's disease, DJD/OA, gout p/w AMS/confusion, generalized weakness. Per my understanding, pt lives at [facility] community w/ his wife, and was brought to the ED 2/2 confusion and dysarthria witnessed by PCP at office visit (Tues 03/02). Per my understanding, pt was evalauted in the ED and D/Ced back to [facility], but then returned to the ED later in the day w/ worsening confusion and weakness. Pt responds to my questions slowly and to the best of his abilities; he appears encephalopathic, with delayed responses that are appropriate (but inaccurate) to what is asked. He is not able to provide much information. He says, "Someone called the ambulance." He is oriented to person, and knows he is in [place], but does not know the city. He states it is 2013. He follows simple commands w/o difficulty. He is w/o complaint. He denies chest pain, abdominal pain, back pain. His speech is slightly slurred/dysarthric but intelligible. He does not exhibit facial asymmetry/droop. He exhibits good muscle strength/ROM. MRI brain (-) acute intracranial process. VSS, does not appear toxic.  PAST MEDICAL HISTORY:   Past Medical History:  Diagnosis Date  . Arthritis    knees  . Parkinson's disease (HCC)    no meds  . Wears hearing aid in both ears     PAST SURGICAL HISTORY:   Past Surgical History:  Procedure Laterality Date  . CATARACT EXTRACTION W/PHACO Left 07/18/2017   Procedure: CATARACT EXTRACTION PHACO AND INTRAOCULAR LENS PLACEMENT (IOC) IVA  TOPICAL  LEFT;  Surgeon: Lockie Mola, MD;  Location: [facility];  Service: Ophthalmology;  Laterality: Left;  . CATARACT EXTRACTION W/PHACO Right 08/15/2017   Procedure: CATARACT EXTRACTION PHACO AND INTRAOCULAR LENS PLACEMENT (IOC) RIGHT;  Surgeon: Lockie Mola, MD;  Location: [facility];  Service: Ophthalmology;  Laterality: Right;  . COLONOSCOPY    . FINGER SURGERY     no anesthesia    SOCIAL HISTORY:   Social History   Tobacco Use  . Smoking status: Former Games developer  . Smokeless tobacco: Never Used  . Tobacco comment: quit in 1970s  Substance Use Topics  . Alcohol use: Yes    Alcohol/week: 5.0 standard drinks    Types: 5 Cans of beer per week    FAMILY HISTORY:  History reviewed. No pertinent family history.  DRUG ALLERGIES:  No Known Allergies  REVIEW OF SYSTEMS:   Review of Systems  Unable to perform ROS: Mental status change  Cardiovascular: Negative for chest pain.  Gastrointestinal: Negative for abdominal pain.  Musculoskeletal: Negative for back pain.   MEDICATIONS AT HOME:   Prior to Admission medications   Medication Sig Start Date End Date Taking? Authorizing Provider  ALLOPURINOL PO Take 300 mg by mouth daily.    Yes [provider]  Apoaequorin (PREVAGEN PO) Take 1 tablet by mouth daily.    Yes [provider]  cholecalciferol (VITAMIN D) 25 MCG (1000 UT) tablet Take 1 tablet by mouth daily.    Yes [provider]  Glucosamine HCl (  GLUCOSAMINE PO) Take 1 tablet by mouth 2 (two) times daily.    Yes [provider]  Multiple Vitamins-Minerals (PRESERVISION AREDS 2 PO) Take 1 tablet by mouth 2 (two) times daily.    Yes [provider]  timolol (BETIMOL) 0.25 % ophthalmic solution Place 1-2 drops into both eyes daily.    Yes [provider]      VITAL SIGNS:  Blood pressure (!) 181/96, pulse 76, temperature 98 F (36.7 C), temperature source Oral, resp. rate 20, weight  71.7 kg, SpO2 100 %.  PHYSICAL EXAMINATION:  Physical Exam Constitutional:      General: He is awake. He is not in acute distress.    Appearance: He is well-developed and normal weight. He is not ill-appearing, toxic-appearing or diaphoretic.     Interventions: He is not intubated. HENT:     Head: Atraumatic.     Mouth/Throat:     Pharynx: Oropharynx is clear.  Eyes:     General: Lids are normal. No scleral icterus.    Extraocular Movements: Extraocular movements intact.     Conjunctiva/sclera: Conjunctivae normal.  Neck:     Musculoskeletal: Neck supple.  Cardiovascular:     Rate and Rhythm: Normal rate and regular rhythm.  No extrasystoles are present.    Heart sounds: Normal heart sounds, S1 normal and S2 normal. Heart sounds not distant. No murmur. No friction rub. No gallop.   Pulmonary:     Effort: Pulmonary effort is normal. No tachypnea, bradypnea, accessory muscle usage, prolonged expiration, respiratory distress or retractions. He is not intubated.     Breath sounds: Normal breath sounds and air entry. No stridor, decreased air movement or transmitted upper airway sounds. No decreased breath sounds, wheezing, rhonchi or rales.  Abdominal:     General: Bowel sounds are decreased. There is no distension.     Palpations: Abdomen is soft.     Tenderness: There is no abdominal tenderness. There is no guarding or rebound.  Musculoskeletal: Normal range of motion.        General: No swelling or tenderness.     Right lower leg: No edema.     Left lower leg: No edema.  Lymphadenopathy:     Cervical: No cervical adenopathy.  Skin:    General: Skin is warm and dry.     Findings: No erythema or rash.  Neurological:     Mental Status: He is alert. He is disoriented and confused.     Cranial Nerves: No facial asymmetry.     Motor: Motor function is intact.     Comments: Oriented to person, place ([place], but not city). Not oriented to year (states it is 2013).  Psychiatric:         Attention and Perception: Attention and perception normal. He is attentive.        Mood and Affect: Mood and affect normal.        Speech: Speech is delayed and slurred.        Behavior: Behavior is slowed. Behavior is cooperative.        Thought Content: Thought content normal.        Cognition and Memory: Cognition is impaired. Memory is impaired.        Judgment: Judgment normal.     Comments: Oriented to person, place ([place], but not city). Not oriented to year (states it is 2013).    LABORATORY PANEL:   CBC Recent Labs  Lab 03/05/18 0058  WBC 9.6  HGB 12.1*  HCT 36.6*  PLT 144*   ------------------------------------------------------------------------------------------------------------------  Chemistries  Recent Labs  Lab 03/05/18 0058  NA 140  K 4.0  CL 105  CO2 27  GLUCOSE 106*  BUN 26*  CREATININE 0.73  CALCIUM 11.4*  AST 37  ALT 34  ALKPHOS 99  BILITOT 0.8   ------------------------------------------------------------------------------------------------------------------  Cardiac Enzymes No results for input(s): TROPONINI in the last 168 hours. ------------------------------------------------------------------------------------------------------------------  RADIOLOGY:  Ct Head Wo Contrast  Result Date: 03/04/2018 CLINICAL DATA:  Speech difficulty.  Possible stroke. EXAM: CT HEAD WITHOUT CONTRAST TECHNIQUE: Contiguous axial images were obtained from the base of the skull through the vertex without intravenous contrast. COMPARISON:  MRI 09/27/2017 FINDINGS: Brain: No acute intracranial abnormality. Specifically, no hemorrhage, hydrocephalus, mass lesion, acute infarction, or significant intracranial injury. Vascular: No hyperdense vessel or unexpected calcification. Skull: No acute calvarial abnormality. Sinuses/Orbits: Visualized paranasal sinuses and mastoids clear. Orbital soft tissues unremarkable. Other: none IMPRESSION: Atrophy, chronic  microvascular disease. No acute intracranial abnormality. Electronically Signed   By: Charlett Nose M.D.   On: 03/04/2018 12:05   Mr Brain Wo Contrast  Result Date: 03/04/2018 CLINICAL DATA:  Slurred speech and altered mental status today. History of Parkinson's disease. EXAM: MRI HEAD WITHOUT CONTRAST TECHNIQUE: Multiplanar, multiecho pulse sequences of the brain and surrounding structures were obtained without intravenous contrast. COMPARISON:  CT HEAD March 04, 2018 and MRI of the head September 27, 2016 FINDINGS: INTRACRANIAL CONTENTS: No reduced diffusion to suggest acute ischemia. Increased number of supra and infratentorial chronic microhemorrhages with disproportion and posterior distribution. Moderate to severe parenchymal brain volume loss. No hydrocephalus. Patchy supratentorial white matter FLAIR T2 hyperintensities. No midline shift, mass effect or masses. No abnormal extra-axial fluid collections. VASCULAR: Normal major intracranial vascular flow voids present at skull base. SKULL AND UPPER CERVICAL SPINE: No abnormal sellar expansion. 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 No suspicious calvarial bone marrow signal. Craniocervical junction maintained. SINUSES/ORBITS: Mild paranasal sinus mucosal thickening. Mastoid air cells are well aerated. Status post bilateral ocular lens implants.The included ocular globes and orbital contents are non-suspicious. OTHER: None. IMPRESSION: 1. No acute intracranial process. 2. Increased number chronic microhemorrhages associated with amyloid angiopathy. 3. Mild chronic small vessel ischemic changes moderate to severe parenchymal brain volume loss. Electronically Signed   By: Awilda Metro M.D.   On: 03/04/2018 17:01   Dg Abd 2 Views  Result Date: 03/04/2018 CLINICAL DATA:  Diarrhea. EXAM: ABDOMEN - 2 VIEW COMPARISON:  None. FINDINGS: Normal bowel gas pattern without free peritoneal air. Arterial calcifications in the left upper quadrant of  the abdomen. Left pelvic phleboliths. Extensive lumbar and lower thoracic spine degenerative changes with mild dextroconvex scoliosis. IMPRESSION: No acute abnormality. Electronically Signed   By: Beckie Salts M.D.   On: 03/04/2018 18:05   IMPRESSION AND PLAN:   A/P: 89M w/ PMHx Parkinson's disease, DJD/OA, gout p/w AMS/confusion, generalized weakness. Hyperglycemia, dehydration, normocytic anemia, thrombocytopenia, microscopic hematuria, proteinuria, pyuria. -AMS/confusion, generalized weakness: Oriented to self, [place] (but not city). Not oriented to year. Appears encephalopathic. MRI brain report reads, "No acute intracranial process." Report does mention, "Increased number chronic microhemorrhages associated with amyloid angiopathy," and, "Mild chronic small vessel ischemic changes moderate to severe parenchymal brain volume loss." AXR unimpressive. TSH, B12, folate, ammonia, RPR pending. Fall precautions. P/T eval. -Dehydration: IVF. -Normocytic anemia: Hgb 12.1, MCV 96.3. U/A (+) hematuria. Monitor. B12, folate pending. -Microscopic hematuria, proteinuria, pyuria: U/A (+) Hgb, > 50 RBC; (+) protein (30); (+) WBC (  11-20). Nitrite, leuk est (-). Renal U/S. Ceftriaxone. -c/w home meds/formulary subs as tolerated. -FEN/GI: Regular diet as tolerated. -DVT PPx: Lovenox. -Code status: Full code. -Disposition: Observation, < 2 midnights. I suspect pt will need long-term placement, but I do not believe pt meets utilization review criteria for inpatient admission. Will likely need social work/case management consultation.   Addendum: Hypercalcemia (11.4). Ionized calcium, Phos, PTHi, vitamin D pending. IVF.   All the records are reviewed and case discussed with ED provider. Management plans discussed with the patient, family and they are in agreement.  CODE STATUS: Full code.  TOTAL TIME TAKING CARE OF THIS PATIENT: 75 minutes.    Barbaraann Rondo M.D on 03/05/2018 at 4:18 AM  Between 7am  to 6pm - Pager - [phone]  After 6pm go to [email or web address] - Social research officer, government  [facility]  Office  [phone]  CC: Primary care physician; Marisue Ivan, MD   Note: This dictation was prepared with Dragon dictation along with smaller phrase technology. Any transcriptional errors that result from this process are unintentional.

## 2018-03-05 NOTE — Care Management Obs Status (Signed)
MEDICARE OBSERVATION STATUS NOTIFICATION   Patient Details  Name: Darrell Fry MRN: [id number] Date of Birth: 1927/02/28   Medicare Observation Status Notification Given:  Yes    Eber Hong, RN 03/05/2018, 7:26 PM

## 2018-03-05 NOTE — ED Notes (Addendum)
ED TO INPATIENT HANDOFF REPORT  ED Nurse Name and Phone #: Victorino Dike  [phone]   S Name/Age/Gender Darrell Fry 85 y.o. male Room/Bed: ED11A/ED11A  Code Status   Code Status: Not on file  Home/SNF/Other Home ([place]) {Patient oriented to place person Is this baseline? no  Triage Complete: Triage complete  Chief Complaint Altered Mental Status/ Generalized Weakness  Triage Note Pt seen in the ED earlier today and discharged back to [place] with dx of weakness and parkinsons. Wife states he has become more confused and weak after being discharged.    Allergies No Known Allergies  Level of Care/Admitting Diagnosis ED Disposition    ED Disposition Condition Comment   Admit  Hospital Area: [facility] [[id number]]  Level of Care: Med-Surg [16]  Diagnosis: AMS (altered mental status) [3086578]  Admitting Physician: Barbaraann Rondo [[id number]]  Attending Physician: Barbaraann Rondo [[id number]]  PT Class (Do Not Modify): Observation [104]  PT Acc Code (Do Not Modify): Observation [10022]       B Medical/Surgery History Past Medical History:  Diagnosis Date  . Arthritis    knees  . Parkinson's disease (HCC)    no meds  . Wears hearing aid in both ears    Past Surgical History:  Procedure Laterality Date  . CATARACT EXTRACTION W/PHACO Left 07/18/2017   Procedure: CATARACT EXTRACTION PHACO AND INTRAOCULAR LENS PLACEMENT (IOC) IVA TOPICAL  LEFT;  Surgeon: Lockie Mola, MD;  Location: [facility];  Service: Ophthalmology;  Laterality: Left;  . CATARACT EXTRACTION W/PHACO Right 08/15/2017   Procedure: CATARACT EXTRACTION PHACO AND INTRAOCULAR LENS PLACEMENT (IOC) RIGHT;  Surgeon: Lockie Mola, MD;  Location: [facility];  Service: Ophthalmology;  Laterality: Right;  . COLONOSCOPY    . FINGER SURGERY     no anesthesia     A IV Location/Drains/Wounds Patient Lines/Drains/Airways Status   Active  Line/Drains/Airways    Name:   Placement date:   Placement time:   Site:   Days:   Peripheral IV 03/05/18 Left Antecubital   03/05/18    0050    Antecubital   less than 1          Intake/Output Last 24 hours  Intake/Output Summary (Last 24 hours) at 03/05/2018 0413 Last data filed at 03/05/2018 0050 Gross per 24 hour  Intake 1 ml  Output -  Net 1 ml    Labs/Imaging Results for orders placed or performed during the hospital encounter of 03/05/18 (from the past 48 hour(s))  Urinalysis, Complete w Microscopic     Status: Abnormal   Collection Time: 03/05/18 12:58 AM  Result Value Ref Range   Color, Urine YELLOW (A) YELLOW   APPearance CLEAR (A) CLEAR   Specific Gravity, Urine 1.020 1.005 - 1.030   pH 6.0 5.0 - 8.0   Glucose, UA NEGATIVE NEGATIVE mg/dL   Hgb urine dipstick MODERATE (A) NEGATIVE   Bilirubin Urine NEGATIVE NEGATIVE   Ketones, ur NEGATIVE NEGATIVE mg/dL   Protein, ur 30 (A) NEGATIVE mg/dL   Nitrite NEGATIVE NEGATIVE   Leukocytes,Ua NEGATIVE NEGATIVE   RBC / HPF >50 (H) 0 - 5 RBC/hpf   WBC, UA 11-20 0 - 5 WBC/hpf   Bacteria, UA RARE (A) NONE SEEN   Squamous Epithelial / LPF 0-5 0 - 5   Mucus PRESENT    Hyaline Casts, UA PRESENT     Comment: Performed at [facility], [address], [place], [place] [address]  CBC     Status:  Abnormal   Collection Time: 03/05/18 12:58 AM  Result Value Ref Range   WBC 9.6 4.0 - 10.5 K/uL   RBC 3.80 (L) 4.22 - 5.81 MIL/uL   Hemoglobin 12.1 (L) 13.0 - 17.0 g/dL   HCT 17.6 (L) 16.0 - 73.7 %   MCV 96.3 80.0 - 100.0 fL   MCH 31.8 26.0 - 34.0 pg   MCHC 33.1 30.0 - 36.0 g/dL   RDW 10.6 26.9 - 48.5 %   Platelets 144 (L) 150 - 400 K/uL   nRBC 0.0 0.0 - 0.2 %    Comment: Performed at [facility], [address] [place], [place] [address]  Comprehensive metabolic panel     Status: Abnormal   Collection Time: 03/05/18 12:58 AM  Result Value Ref Range   Sodium 140 135 - 145 mmol/L   Potassium 4.0 3.5 - 5.1  mmol/L   Chloride 105 98 - 111 mmol/L   CO2 27 22 - 32 mmol/L   Glucose, Bld 106 (H) 70 - 99 mg/dL   BUN 26 (H) 8 - 23 mg/dL   Creatinine, Ser 3.50 0.61 - 1.24 mg/dL   Calcium 09.3 (H) 8.9 - 10.3 mg/dL   Total Protein 6.8 6.5 - 8.1 g/dL   Albumin 3.8 3.5 - 5.0 g/dL   AST 37 15 - 41 U/L   ALT 34 0 - 44 U/L   Alkaline Phosphatase 99 38 - 126 U/L   Total Bilirubin 0.8 0.3 - 1.2 mg/dL   GFR calc non Af Amer >60 >60 mL/min   GFR calc Af Amer >60 >60 mL/min   Anion gap 8 5 - 15    Comment: Performed at [facility], [address] [place], [place] [address]   Ct Head Wo Contrast  Result Date: 03/04/2018 CLINICAL DATA:  Speech difficulty.  Possible stroke. EXAM: CT HEAD WITHOUT CONTRAST TECHNIQUE: Contiguous axial images were obtained from the base of the skull through the vertex without intravenous contrast. COMPARISON:  MRI 09/27/2017 FINDINGS: Brain: No acute intracranial abnormality. Specifically, no hemorrhage, hydrocephalus, mass lesion, acute infarction, or significant intracranial injury. Vascular: No hyperdense vessel or unexpected calcification. Skull: No acute calvarial abnormality. Sinuses/Orbits: Visualized paranasal sinuses and mastoids clear. Orbital soft tissues unremarkable. Other: none IMPRESSION: Atrophy, chronic microvascular disease. No acute intracranial abnormality. Electronically Signed   By: Charlett Nose M.D.   On: 03/04/2018 12:05   Mr Brain Wo Contrast  Result Date: 03/04/2018 CLINICAL DATA:  Slurred speech and altered mental status today. History of Parkinson's disease. EXAM: MRI HEAD WITHOUT CONTRAST TECHNIQUE: Multiplanar, multiecho pulse sequences of the brain and surrounding structures were obtained without intravenous contrast. COMPARISON:  CT HEAD March 04, 2018 and MRI of the head September 27, 2016 FINDINGS: INTRACRANIAL CONTENTS: No reduced diffusion to suggest acute ischemia. Increased number of supra and infratentorial chronic microhemorrhages with  disproportion and posterior distribution. Moderate to severe parenchymal brain volume loss. No hydrocephalus. Patchy supratentorial white matter FLAIR T2 hyperintensities. No midline shift, mass effect or masses. No abnormal extra-axial fluid collections. VASCULAR: Normal major intracranial vascular flow voids present at skull base. SKULL AND UPPER CERVICAL SPINE: No abnormal sellar expansion. 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 No suspicious calvarial bone marrow signal. Craniocervical junction maintained. SINUSES/ORBITS: Mild paranasal sinus mucosal thickening. Mastoid air cells are well aerated. Status post bilateral ocular lens implants.The included ocular globes and orbital contents are non-suspicious. OTHER: None. IMPRESSION: 1. No acute intracranial process. 2. Increased number chronic microhemorrhages associated with  amyloid angiopathy. 3. Mild chronic small vessel ischemic changes moderate to severe parenchymal brain volume loss. Electronically Signed   By: Awilda Metro M.D.   On: 03/04/2018 17:01   Dg Abd 2 Views  Result Date: 03/04/2018 CLINICAL DATA:  Diarrhea. EXAM: ABDOMEN - 2 VIEW COMPARISON:  None. FINDINGS: Normal bowel gas pattern without free peritoneal air. Arterial calcifications in the left upper quadrant of the abdomen. Left pelvic phleboliths. Extensive lumbar and lower thoracic spine degenerative changes with mild dextroconvex scoliosis. IMPRESSION: No acute abnormality. Electronically Signed   By: Beckie Salts M.D.   On: 03/04/2018 18:05    Pending Labs Unresulted Labs (From admission, onward)    Start     Ordered   03/05/18 0341  TSH  Add-on,   AD    Question:  Specimen collection method  Answer:  Unit=Unit collect   03/05/18 0340   03/05/18 0341  Vitamin B12  Add-on,   AD    Question:  Specimen collection method  Answer:  Unit=Unit collect   03/05/18 0340   03/05/18 0341  Folate  Add-on,   AD    Question:  Specimen collection method  Answer:   Unit=Unit collect   03/05/18 0340   03/05/18 0341  Ammonia  Once,   STAT    Question:  Specimen collection method  Answer:  Unit=Unit collect   03/05/18 0340   03/05/18 0341  RPR  Add-on,   AD    Question:  Specimen collection method  Answer:  Unit=Unit collect   03/05/18 0340   03/05/18 0341  Rapid HIV screen (HIV 1/2 Ab+Ag) ([facility] Only)  Add-on,   AD    Question:  Specimen collection method  Answer:  Unit=Unit collect   03/05/18 0340   03/05/18 0340  Magnesium  Add-on,   AD    Question:  Specimen collection method  Answer:  Unit=Unit collect   03/05/18 0340   03/05/18 0340  Phosphorus  Add-on,   AD    Question:  Specimen collection method  Answer:  Unit=Unit collect   03/05/18 0340          Vitals/Pain Today's Vitals   03/05/18 0052 03/05/18 0053  BP:  (!) 181/96  Pulse:  76  Resp:  20  Temp:  98 F (36.7 C)  TempSrc:  Oral  SpO2:  100%  Weight: 71.7 kg   PainSc: 0-No pain     Isolation Precautions No active isolations  Medications Medications  lactated ringers infusion (has no administration in time range)    Mobility walks with device High fall risk   Focused Assessments Neuro Assessment Handoff:  Swallow screen pass? Yes          Neuro Assessment:   Neuro Checks:      Last Documented NIHSS Modified Score:   Has TPA been given? No If patient is a Neuro Trauma and patient is going to OR before floor call report to 4N Charge nurse: [phone] or [phone]     R Recommendations: See Admitting Provider Note  Report given to: Marcella  Additional Notes: lives with Wife Tobi Bastos

## 2018-03-05 NOTE — Progress Notes (Addendum)
Bladder scan > 500 notified Dr Katheren Shams. See new orders.

## 2018-03-05 NOTE — Evaluation (Addendum)
Clinical/Bedside Swallow Evaluation Patient Details  Name: Darrell Fry MRN: [id number] Date of Birth: 04-10-1927  Today's Date: 03/05/2018 Time: SLP Start Time (ACUTE ONLY): 1200 SLP Stop Time (ACUTE ONLY): 1300 SLP Time Calculation (min) (ACUTE ONLY): 60 min  Past Medical History:  Past Medical History:  Diagnosis Date  . Arthritis    knees  . Cerebral amyloid angiopathy (HCC)   . Parkinson's disease (HCC)    no meds  . Parkinson's disease dementia (HCC)   . REM sleep behavior disorder   . Wears hearing aid in both ears    Past Surgical History:  Past Surgical History:  Procedure Laterality Date  . CATARACT EXTRACTION W/PHACO Left 07/18/2017   Procedure: CATARACT EXTRACTION PHACO AND INTRAOCULAR LENS PLACEMENT (IOC) IVA TOPICAL  LEFT;  Surgeon: Lockie Mola, MD;  Location: [facility];  Service: Ophthalmology;  Laterality: Left;  . CATARACT EXTRACTION W/PHACO Right 08/15/2017   Procedure: CATARACT EXTRACTION PHACO AND INTRAOCULAR LENS PLACEMENT (IOC) RIGHT;  Surgeon: Lockie Mola, MD;  Location: [facility];  Service: Ophthalmology;  Laterality: Right;  . COLONOSCOPY    . FINGER SURGERY     no anesthesia   HPI:   Pt is an 85 y.o. male Parkinson's disease, Parkinson's disease Dementia, REM sleep behavior disorder, and cerebral amyloid angiopathy presenting to the ED on 03/04/2018 with altered mental status and significant speech difficulty.  Patient's wife reports that patient has been having difficulty with movement often noted to have gait freezing in which his feet get stuck on the floor and difficulty walking.  Patient's wife reported he was started on carbidopa levodopa 25/100 mg 1 tab 3 times a day for his Parkinson's however he was unable to tolerate the side effects and the dose was reduced to 2 times a day but still had hallucination therefore she/he STOPPED the medication.  Per patient's wife since stopping the medication he has had difficulty with  walking and has become much slower, worsening tremors and difficulty with speech.  Patient's wife reports that yesterday morning patient had difficulty talking and was mute for several minutes prompting her to take him to the clinic for further evaluation. In the clinic he was noted to have significant dysarthria he was therefore sent to the ED for further evaluation of possible stroke.On arrival to the ED, he was afebrile with blood pressure 116/53 mm Hg and pulse rate 69 beats/min. There were no focal neurological deficits; he was alert and oriented but mild confusion was noted in engagement. All movements are slower; tremors in UEs during feeding tasks.  Assessment / Plan / Recommendation Clinical Impression  Pt appears to present w/ min oropharyngeal phase dysphagia - suspect pt could have mildly delayed pharyngeal swallow initiation at this time d/t declined Cognitive status and impact of illness/hospitalization. Pt does have a baseline dx of Parkinson's Dis. He appeared to exhibit slower motor movements including min slower oral phase bolus management w/ increased texture and when sipping liquids via cup. Pt consumed po trials of ice chips w/ adequate mastication and swallow, then trials of Nectar liquids and purees/soft solids w/ no immediate, overt s/s of aspiration noted; no coughing or decline in respiratory status during/post po trials. Suspect possibility of delayed pharyngeal swallow initiation d/t baseline Parkinson's Dis. Oral phase c/b min slower oral motor movements but given time, adequate mastication, bolus transfer, swallowing, and oral clearing was achieved w/ all trials given. Pt was able to feed self w/ support and prep/setup; verbal cues to encourage initiation of  task. Recommend a Dysphagia level 3 (mech soft) w/ Nectar liquids currently; trials to upgrade to thin liquids hopefully in the next 1-2 days. Recommend aspiration precautions; Pills in Puree for safer swallowing, clearing. Tray  setup and support w/ feeding at meals. NSG/MD updated. Pt is deemed at a higher risk for aspiration at this time d/t current hospitalizations, illness(dehydration in ED), and not taking any medication for Parkinson's dis. Neurology reported re-instating Parkinson's medication at a different dosage to address s/s of the Parkinson's dis. Suspect this could benefit pt's swallowing once the medication is in the system regularly. MD, Wife agreed.  SLP Visit Diagnosis: Dysphagia, oropharyngeal phase (R13.12)(baseline Parkinson's Dis.)    Aspiration Risk  Mild aspiration risk;Risk for inadequate nutrition/hydration    Diet Recommendation  Dysphagia level 3 (mech soft) w/ Nectar liquids; aspiration precautions; support and Supervision during meals and give assistance w/ feeding as needed d/t baseline Parkinson's Dis.  Medication Administration: Whole meds with puree(or Crushed as needed d/t Cognitive status)    Other  Recommendations Recommended Consults: (Dietician f/u) Oral Care Recommendations: Oral care BID;Staff/trained caregiver to provide oral care Other Recommendations: Order thickener from pharmacy;Prohibited food (jello, ice cream, thin soups);Remove water pitcher;Have oral suction available   Follow up Recommendations (TBD)      Frequency and Duration min 3x week  2 weeks       Prognosis Prognosis for Safe Diet Advancement: Fair(-Good) Barriers to Reach Goals: Cognitive deficits;Time post onset;Severity of deficits(baseline Parkinson's Dis.)      Swallow Study   General Date of Onset: 03/05/18 Type of Study: Bedside Swallow Evaluation Previous Swallow Assessment: none reported by wife Diet Prior to this Study: Regular;Thin liquids Temperature Spikes Noted: No(wbc 9.6) Respiratory Status: Room air History of Recent Intubation: No Behavior/Cognition: Cooperative;Pleasant mood;Confused;Distractible;Requires cueing(Awake; baseline dx of Parkinson's Dis.) Oral Cavity Assessment:  Within Functional Limits Oral Care Completed by SLP: Recent completion by staff Oral Cavity - Dentition: Adequate natural dentition Vision: Functional for self-feeding Self-Feeding Abilities: Able to feed self;Needs assist;Needs set up(stiff UEs; uncoordinated movement) Patient Positioning: Upright in bed(needed positioning) Baseline Vocal Quality: Low vocal intensity(baseline Parkinson's Dis.) Volitional Cough: (Fair) Volitional Swallow: Able to elicit(w/ time given)    Oral/Motor/Sensory Function Overall Oral Motor/Sensory Function: Generalized oral weakness(baseline Parkinson's Dis. - movements were minimal) Facial Symmetry: Within Functional Limits Lingual ROM: Within Functional Limits(appeared) Lingual Symmetry: Within Functional Limits   Ice Chips Ice chips: Within functional limits Presentation: Spoon(fed; 6 trials) Other Comments: masticated bolus trials; fairly timely oral phase then swallow   Thin Liquid Thin Liquid: Not tested    Nectar Thick Nectar Thick Liquid: Impaired Presentation: Cup;Self Fed(assisted; ~3 ozs total) Oral Phase Impairments: (slow movements orally) Oral phase functional implications: Prolonged oral transit(intermittently) Pharyngeal Phase Impairments: (none) Other Comments: baseline Parkinson's Dis.   Honey Thick Honey Thick Liquid: Not tested   Puree Puree: Within functional limits Presentation: Spoon;Self Fed(assisted; 4 ozs)   Solid     Solid: Impaired Presentation: Spoon(fed; 5 trials) Oral Phase Impairments: Reduced lingual movement/coordination(min) Oral Phase Functional Implications: Prolonged oral transit(min) Pharyngeal Phase Impairments: (none) Other Comments: baseline Parkinson's Dis.       Darrell Som, MS, CCC-SLP Darrell Fry 03/05/2018,4:48 PM

## 2018-03-05 NOTE — Consult Note (Addendum)
Reason for Consult:Speech difficulty, weakness and confusion, Parkinson Disease Referring Physician: Betsey Amen, MD  CC: Speech difficulty, weakness, confusion  HPI: Darrell Fry is an 85 y.o. male Parkinson's disease, Parkinson's disease dementia, REM sleep behavior disorder, and cerebral amyloid angiopathy presenting to the ED on 03/04/2018 with altered mental status and significant speech difficulty.  Patient's wife reports that patient has been having difficulty with movement often noted to have gait freezing in which his feet get stuck on the floor and difficulty walking.  Patient's wife reported he was started on carbidopa levodopa 25/100 mg 1 tab 3 times a day for his Parkinson's however he was unable to tolerate the side effects and the dose was reduced to 2 times a day but still had hallucination therefore he stopped the medication.  Per patient's wife since stopping the medication he has had difficulty with walking and has become much slower, worsening tremors and difficulty with speech.  Patient's wife reports that yesterday morning patient had difficulty talking and was mute for several minutes prompting her to take him to the clinic for further evaluation. In the clinic he was noted to have significant dysarthria he was therefore sent to the ED for further evaluation of possible stroke.On arrival to the ED, he was afebrile with blood pressure 116/53 mm Hg and pulse rate 69 beats/min. There were no focal neurological deficits; he was alert and oriented x4. Complete blood count (CBC), thyroid-stimulating hormone (TSH) -reflex, urinalysis, and comprehensive metabolic panel (CMP) were unremarkable, ammonia 30 ; an insidious infectious workup was initiated, including rapid HIV and RPR that were ultimately negative. ECG showed sinus rhythm of 78 beats per minute and the chest X-ray was normal. A non-contrast head CT showed acute intracranial abnormality.  Patient was ultimately at discharge to [facility] however he came back the same day with complaints of more confusion and weakness after being discharged.  An MRI of the brain was obtained and showed no acute intracranial abnormality.  Patient was therefore admitted for further monitoring.  Past Medical History:  Diagnosis Date  . Arthritis    knees  . Cerebral amyloid angiopathy (HCC)   . Parkinson's disease (HCC)    no meds  . Parkinson's disease dementia (HCC)   . REM sleep behavior disorder   . Wears hearing aid in both ears     Past Surgical History:  Procedure Laterality Date  . CATARACT EXTRACTION W/PHACO Left 07/18/2017   Procedure: CATARACT EXTRACTION PHACO AND INTRAOCULAR LENS PLACEMENT (IOC) IVA TOPICAL  LEFT;  Surgeon: Lockie Mola, MD;  Location: [facility];  Service: Ophthalmology;  Laterality: Left;  . CATARACT EXTRACTION W/PHACO Right 08/15/2017   Procedure: CATARACT EXTRACTION PHACO AND INTRAOCULAR LENS PLACEMENT (IOC) RIGHT;  Surgeon: Lockie Mola, MD;  Location: [facility];  Service: Ophthalmology;  Laterality: Right;  . COLONOSCOPY    . FINGER SURGERY     no anesthesia    History reviewed. No pertinent family history.  Social History:  reports that he has quit smoking. He has never used smokeless tobacco. He reports current alcohol use of about 5.0 standard drinks of alcohol per week. No history on file for drug.  No Known Allergies  Medications:  I have reviewed the patient's current medications. Prior to Admission:  Medications Prior to Admission  Medication Sig Dispense Refill Last Dose  . ALLOPURINOL PO Take 300 mg by mouth daily.    03/04/2018 at Unknown time  . Apoaequorin (PREVAGEN PO) Take 1 tablet by mouth  daily.    03/04/2018 at Unknown time  . cholecalciferol (VITAMIN D) 25 MCG (1000 UT) tablet Take 1 tablet by mouth daily.    03/04/2018 at Unknown time  . Glucosamine HCl (GLUCOSAMINE PO) Take 1 tablet by mouth 2 (two) times daily.    03/04/2018 at Unknown time  .  Multiple Vitamins-Minerals (PRESERVISION AREDS 2 PO) Take 1 tablet by mouth 2 (two) times daily.    03/04/2018 at Unknown time  . timolol (BETIMOL) 0.25 % ophthalmic solution Place 1-2 drops into both eyes daily.    03/04/2018 at Unknown time   Scheduled: . allopurinol  300 mg Oral Daily  . Carbidopa-Levodopa ER  1 tablet Oral BID  . enoxaparin (LOVENOX) injection  40 mg Subcutaneous Q24H  . tamsulosin  0.4 mg Oral Daily  . timolol  1 drop Both Eyes Daily    ROS: History obtained from the patient   General ROS: negative for - chills, fatigue, fever, night sweats, weight gain or weight loss Psychological ROS: positive for - hallucinations, memory difficulties Ophthalmic ROS: negative for - blurry vision, double vision, eye pain or loss of vision ENT ROS: negative for - epistaxis, nasal discharge, oral lesions, sore throat, tinnitus or vertigo Allergy and Immunology ROS: negative for - hives or itchy/watery eyes Hematological and Lymphatic ROS: negative for - bleeding problems, bruising or swollen lymph nodes Endocrine ROS: negative for - galactorrhea, hair pattern changes, polydipsia/polyuria or temperature intolerance Respiratory ROS: negative for - cough, hemoptysis, shortness of breath or wheezing Cardiovascular ROS: negative for - chest pain, dyspnea on exertion, edema or irregular heartbeat Gastrointestinal ROS: positive for - abdominal pain, constipation Genito-Urinary ROS: positive  for -  urinary frequency/urgency Musculoskeletal ROS: negative for - joint swelling or muscular weakness Neurological ROS: as noted in HPI Dermatological ROS: negative for rash and skin lesion changes  Physical Examination: Blood pressure (!) 145/67, pulse 74, temperature 98.5 F (36.9 C), temperature source Oral, resp. rate 18, height  (1.651 m), weight 70.6 kg, SpO2 95 %.  HEENT-  Normocephalic, no lesions, without obvious abnormality.  Normal external eye and conjunctiva.  Normal TM's  bilaterally.  Normal auditory canals and external ears. Normal external nose, mucus membranes and septum.  Normal pharynx. Cardiovascular- S1, S2 normal, pulses palpable throughout   Lungs- chest clear, no wheezing, rales, normal symmetric air entry Abdomen- soft, non-tender; bowel sounds normal; no masses,  no organomegaly Extremities- no edema Lymph-no adenopathy palpable Musculoskeletal-no joint tenderness, deformity or swelling Skin-warm and dry, no hyperpigmentation, vitiligo, or suspicious lesions  Neurological Exam   Mental Status: Alert, oriented, thought content appropriate.  Speech hypophonic without evidence of aphasia.  Able to follow 3 step commands without difficulty. Attention span and concentration seemed appropriate  Cranial Nerves: II: Discs flat bilaterally; Visual fields grossly normal, pupils equal, round, reactive to light and accommodation III,IV, VI: ptosis not present, extra-ocular motions intact bilaterally V,VII: smile symmetric, facial light touch sensation intact VIII: hearing normal bilaterally IX,X: gag reflex present XI: bilateral shoulder shrug XII: midline tongue extension Motor: Right :  Upper extremity   5/5 Without pronator drift      Left: Upper extremity   5/5 without pronator drift Right:   Lower extremity   4+/5                                          Left: Lower extremity  4+/5 Rigid tone in right upper extremity Mild resting tremor of right hand Bradykinesia right>left Bradyphrenia Sensory: Pinprick and light touch intact bilaterally Deep Tendon Reflexes: 2+ and symmetric throughout Plantars: Right: mute                              Left: mute Cerebellar: Moderate bradykinesia noted bilaterally Gait: not tested due to safety concerns  Data Reviewed  Laboratory Studies:   Basic Metabolic Panel: Recent Labs  Lab 03/04/18 1141 03/05/18 0058 03/05/18 0422  NA 138 140  --   K 4.0 4.0  --   CL 104 105  --   CO2 24 27  --    GLUCOSE 112* 106*  --   BUN 24* 26*  --   CREATININE 0.82 0.73  --   CALCIUM 11.4* 11.4*  --   MG  --   --  2.1  PHOS  --   --  2.4*    Liver Function Tests: Recent Labs  Lab 03/04/18 1141 03/05/18 0058  AST 41 37  ALT 36 34  ALKPHOS 94 99  BILITOT 0.7 0.8  PROT 7.2 6.8  ALBUMIN 4.3 3.8   No results for input(s): LIPASE, AMYLASE in the last 168 hours. Recent Labs  Lab 03/05/18 0422  AMMONIA 30    CBC: Recent Labs  Lab 03/04/18 1141 03/05/18 0058  WBC 11.2* 9.6  NEUTROABS 8.9*  --   HGB 12.6* 12.1*  HCT 38.1* 36.6*  MCV 95.7 96.3  PLT 168 144*    Cardiac Enzymes: No results for input(s): CKTOTAL, CKMB, CKMBINDEX, TROPONINI in the last 168 hours.  BNP: Invalid input(s): POCBNP  CBG: Recent Labs  Lab 03/04/18 1604  GLUCAP 194*    Microbiology: No results found for this or any previous visit.  Coagulation Studies: Recent Labs    03/04/18 1141  LABPROT 13.5  INR 1.0    Urinalysis:  Recent Labs  Lab 03/05/18 0058  COLORURINE YELLOW*  LABSPEC 1.020  PHURINE 6.0  GLUCOSEU NEGATIVE  HGBUR MODERATE*  BILIRUBINUR NEGATIVE  KETONESUR NEGATIVE  PROTEINUR 30*  NITRITE NEGATIVE  LEUKOCYTESUR NEGATIVE    Lipid Panel:  No results found for: CHOL, TRIG, HDL, CHOLHDL, VLDL, LDLCALC  HgbA1C: No results found for: HGBA1C  Urine Drug Screen:  No results found for: LABOPIA, COCAINSCRNUR, LABBENZ, AMPHETMU, THCU, LABBARB  Alcohol Level: No results for input(s): ETH in the last 168 hours.  Other results: EKG: normal EKG, normal sinus rhythm, unchanged from previous tracings. Vent. rate 78 BPM PR interval * ms QRS duration 147 ms QT/QTc 341/389 ms P-R-T axes 68 75 29  Imaging: Ct Head Wo Contrast  Result Date: 03/04/2018 CLINICAL DATA:  Speech difficulty.  Possible stroke. EXAM: CT HEAD WITHOUT CONTRAST TECHNIQUE: Contiguous axial images were obtained from the base of the skull through the vertex without intravenous contrast. COMPARISON:  MRI  09/27/2017 FINDINGS: Brain: No acute intracranial abnormality. Specifically, no hemorrhage, hydrocephalus, mass lesion, acute infarction, or significant intracranial injury. Vascular: No hyperdense vessel or unexpected calcification. Skull: No acute calvarial abnormality. Sinuses/Orbits: Visualized paranasal sinuses and mastoids clear. Orbital soft tissues unremarkable. Other: none IMPRESSION: Atrophy, chronic microvascular disease. No acute intracranial abnormality. Electronically Signed   By: Charlett Nose M.D.   On: 03/04/2018 12:05   Mr Brain Wo Contrast  Result Date: 03/04/2018 CLINICAL DATA:  Slurred speech and altered mental status today. History of Parkinson's disease. EXAM: MRI HEAD WITHOUT CONTRAST TECHNIQUE:  Multiplanar, multiecho pulse sequences of the brain and surrounding structures were obtained without intravenous contrast. COMPARISON:  CT HEAD March 04, 2018 and MRI of the head September 27, 2016 FINDINGS: INTRACRANIAL CONTENTS: No reduced diffusion to suggest acute ischemia. Increased number of supra and infratentorial chronic microhemorrhages with disproportion and posterior distribution. Moderate to severe parenchymal brain volume loss. No hydrocephalus. Patchy supratentorial white matter FLAIR T2 hyperintensities. No midline shift, mass effect or masses. No abnormal extra-axial fluid collections. VASCULAR: Normal major intracranial vascular flow voids present at skull base. SKULL AND UPPER CERVICAL SPINE: No abnormal sellar expansion. 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 No suspicious calvarial bone marrow signal. Craniocervical junction maintained. SINUSES/ORBITS: Mild paranasal sinus mucosal thickening. Mastoid air cells are well aerated. Status post bilateral ocular lens implants.The included ocular globes and orbital contents are non-suspicious. OTHER: None. IMPRESSION: 1. No acute intracranial process. 2. Increased number chronic microhemorrhages associated with amyloid  angiopathy. 3. Mild chronic small vessel ischemic changes moderate to severe parenchymal brain volume loss. Electronically Signed   By: Awilda Metro M.D.   On: 03/04/2018 17:01   US Renal  Result Date: 03/05/2018 CLINICAL DATA:  85 year old presenting with microscopic hematuria. EXAM: RENAL / URINARY TRACT ULTRASOUND COMPLETE COMPARISON:  None. FINDINGS: Right Kidney: Renal measurements: Approximately 11.3 x 5.7 x 7.2 cm = volume: 245 mL. Well-preserved cortex for patient age. Normal parenchymal echotexture. No hydronephrosis. Approximate 6.9 x 8.2 x 7.9 cm benign cyst arising from the mid kidney. No visible solid renal masses. Left Kidney: Renal measurements: Approximately 12.0 x 6.0 x 5.6 cm = volume: 211 mL. Well-preserved cortex for patient age. Normal parenchymal echotexture. No hydronephrosis. Approximate 4.0 x 2.4 x 2.2 cm benign cyst arising from the mid to upper kidney. No visible solid renal masses. Bladder: Approximate 2.1 x 2.0 x 2.2 cm mass posteriorly within the bladder IMPRESSION: 1. Benign cysts involving both kidneys. No significant abnormalities involving either kidney. 2. Possible 2 cm mass involving the POSTERIOR wall of the urinary bladder (favored over a small hematoma in the dependent portion of the bladder). Electronically Signed   By: Hulan Saas M.D.   On: 03/05/2018 12:01   Dg Abd 2 Views  Result Date: 03/04/2018 CLINICAL DATA:  Diarrhea. EXAM: ABDOMEN - 2 VIEW COMPARISON:  None. FINDINGS: Normal bowel gas pattern without free peritoneal air. Arterial calcifications in the left upper quadrant of the abdomen. Left pelvic phleboliths. Extensive lumbar and lower thoracic spine degenerative changes with mild dextroconvex scoliosis. IMPRESSION: No acute abnormality. Electronically Signed   By: Beckie Salts M.D.   On: 03/04/2018 18:05   Assessment: 85 year old male with past medical history of  Parkinson's disease, Parkinson's disease dementia, REM sleep behavior disorder, and  cerebral amyloid angiopathy presenting to the ED on 03/04/2018 with altered mental status and significant speech difficulty.  Initial concerns for ischemic event due to dysarthria and altered mental status.  However MRI of the brain reviewed and shows no acute intracranial abnormality. Increased number chronic microhemorrhages associated with amyloid angiopathy also noted. Likely associated with dementia in parkinson disease, and could be contributing to progressive cognitive decline in a patient with hx of parkinson disease and dementia. Labs unrevealing. Etiology of clinical presentation likely due to progression of neurodegenerative disorder.  Discussed this finding with patient's wife who is currently at the bedside she verbalized understanding of disease progression and will continue to follow-up with outpatient neurology.  Patient's wife agree with starting trial of CR carbidopa-levodopa. Unable to tolerate IR  in the past due to hallucinations.  Recommendations: 1.  Start CR carbidopa levodopa 25/100 mg 1 tab twice daily.  Will remain on this at discharge for outpatient physician to make further adjustment as tolerated. 2.  PT/OT/speech to evaluate   This patient was staffed with Dr. Loretha Brasil, Doyle Askew who personally evaluated patient, reviewed documentation and agreed with assessment and plan of care as above.  Webb Silversmith, DNP, FNP-BC Board certified Nurse Practitioner Neurology Department    03/05/2018, 3:06 PM

## 2018-03-05 NOTE — Care Management Note (Signed)
Case Management Note  Patient Details  Name: Robertocarlos Velador MRN: [id number] Date of Birth: Jun 20, 1927  Subjective/Objective:                 patient and his wife are residents in the [facility] independent living.  Placed in observation for symptoms concerning for CVA. Has ruled out for CVA and it is though his symptoms are most likely related to his parkinsons.  Had foley placed for retention.  CM discussed observation criteria and if she was considering skilled nursing placement, it would not be covered  without a three night medically necessary "inpatient" stay.  She does have 3 days of resident as a benefit at [facility].  Wife says patient has a lot of care needs that she is not sure she can continue to provide.CM discussed that if needed a skilled nursing stay, [facility] could provide but the cost would be 295 dollars a day.   Patient wants to go home and not stay in a facility.  CM contacted Sedonia Small RN who oversees the independent living residents.  She confirms that patient could have physical therapy from [facility] see the patient in the home. An order for outpatient physical therapy (not home health) and faxed to [phone] Attention Tiffany.  CM updated Darl Pikes on wife's need for additional in home support. Will need to update [facility] independent living staff  of discharge plans.  Action/Plan:   Anticipate discharge 3.4  Expected Discharge Date:  03/08/18               Expected Discharge Plan:     In-House Referral:     Discharge planning Services     Post Acute Care Choice:    Choice offered to:     DME Arranged:    DME Agency:     HH Arranged:    HH Agency:     Status of Service:     If discussed at [employer], dates discussed:    Additional Comments:  Eber Hong, RN 03/05/2018, 7:27 PM

## 2018-03-05 NOTE — Progress Notes (Signed)
Unable to apply continuous pulse oximetry d/t no telemetry boxes available at admission; A/O X 3; occasional word salad; able to relay some history; no acute distress noted at this time. Windy Carina, RN 6:23 AM 03/05/2018

## 2018-03-05 NOTE — Progress Notes (Signed)
Initial Nutrition Assessment  DOCUMENTATION CODES:   Non-severe (moderate) malnutrition in context of chronic illness  INTERVENTION:  Provide Hormel Shake (Vital Cuisine) po TID with meals, each supplement provides 520 kcal and 22 grams of protein. Patient prefers vanilla.  Provide Magic cup TID with meals, each supplement provides 290 kcal and 9 grams of protein. Patient prefers vanilla.  NUTRITION DIAGNOSIS:   Moderate Malnutrition related to chronic illness(Parkinson's disease, inadequate oral intake, advanced age) as evidenced by moderate fat depletion, moderate muscle depletion.  GOAL:   Patient will meet greater than or equal to 90% of their needs  MONITOR:   PO intake, Supplement acceptance, Labs, Weight trends, I & O's  REASON FOR ASSESSMENT:   Malnutrition Screening Tool    ASSESSMENT:   85 year old male with PMHx of Parkinson's disease, dementia, arthritis admitted with worsening Parkinson's disease.   -Following SLP evaluation today diet was downgraded to dysphagia 3 with nectar-thick liquids.  Met with patient and wife at bedside. Patient sleeping and unable to provide any history. Wife reports his intake has been decreasing for a while now. He is eating mainly bites at meals now. She reports it takes him a lot longer to eat now than it used to. He can usually feed himself if she sets the food up and cuts everything for him, but now he has been needing help with eating lately. She reports patient enjoys sweets and had about 1/2 of the [employer] today. His favorite flavor is vanilla.  She reports he has lost "a lot" of weight from his UBW but that has been slowly over time. He has been weight-stable at around 160 lbs over the past year. Current weight is 70.6 kg (155.65 lbs).  Meal Completion: 10%  Medications reviewed and include: allopurinol, Flomax.  Labs reviewed: BUN 26, Phosphorus 2.4.  NUTRITION - FOCUSED PHYSICAL EXAM:    Most Recent Value  Orbital  Region  Moderate depletion  Upper Arm Region  Severe depletion  Thoracic and Lumbar Region  Moderate depletion  Buccal Region  Moderate depletion  Temple Region  Moderate depletion  Clavicle Bone Region  Moderate depletion  Clavicle and Acromion Bone Region  Moderate depletion  Scapular Bone Region  Mild depletion  Dorsal Hand  Moderate depletion  Patellar Region  Moderate depletion  Anterior Thigh Region  Moderate depletion  Posterior Calf Region  Mild depletion  Edema (RD Assessment)  None  Hair  Reviewed  Eyes  Unable to assess  Mouth  Unable to assess  Skin  Reviewed  Nails  Reviewed     Diet Order:   Diet Order            DIET DYS 3 Room service appropriate? Yes with Assist; Fluid consistency: Nectar Thick  Diet effective now             EDUCATION NEEDS:   No education needs have been identified at this time  Skin:  Skin Assessment: Reviewed RN Assessment  Last BM:  03/05/2018 - medium type 1  Height:   Ht Readings from Last 1 Encounters:  03/05/18 '5\' 5"'$  (1.651 m)   Weight:   Wt Readings from Last 1 Encounters:  03/05/18 70.6 kg   Ideal Body Weight:  56.8 kg  BMI:  Body mass index is 25.9 kg/m.  Estimated Nutritional Needs:   Kcal:  1570-1830 (MSJ x 1.2-1.4)  Protein:  85-100 grams (1.2-1.4 grams/kg)  Fluid:  1.5-1.8 L/day (1 mL/kcal)  Willey Blade, MS, RD, LDN  Office: [phone] Pager: [phone] After Hours/Weekend Pager: [phone]

## 2018-03-05 NOTE — Evaluation (Signed)
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  NP was in to ask PT to assess pre-medication for Parkinson's with new med, and will see tomorrow as a comparison.  PMHx:  OA, amyloid angiopathy, Parkinson's, HOH with augmented hearing devices, cataract extractions, dementia  Clinical Impression  Pt is up to stand with PT today, max assist to partially come off the bed with help on RW.  Pt is willing to try to work but is confused so PT is directing all movement with small dense cues.  Pt is appropriate for transition to SNF as he has been and independent walker prior to the decline of strength.  Plan is for NP to give new med with effect on Parkinson's tomorrow and PT to return to assess the benefit in light of today.  Follow acutely for progression of gait and transfers.    Follow Up Recommendations SNF    Equipment Recommendations  Rolling walker with 5" wheels(if pt does not have adequate equipment)    Recommendations for Other Services       Precautions / Restrictions Precautions Precautions: Fall Precaution Comments: foley Restrictions Weight Bearing Restrictions: No      Mobility  Bed Mobility Overal bed mobility: Needs Assistance Bed Mobility: Supine to Sit;Sit to Supine     Supine to sit: Min assist Sit to supine: Min assist   General bed mobility comments: min assist for legs and trunk support  Transfers Overall transfer level: Needs assistance Equipment used: Rolling walker (2 wheeled);1 person hand held assist Transfers: Sit to/from Stand Sit to Stand: Max assist         General transfer comment: max assist to power up and then stood briefly  Ambulation/Gait             General Gait Details: unable to  step  Stairs            Wheelchair Mobility    Modified Rankin (Stroke Patients Only)       Balance Overall balance assessment: Needs assistance Sitting-balance support: Feet supported;Bilateral upper extremity supported Sitting balance-Leahy Scale: Fair     Standing balance support: Bilateral upper extremity supported;During functional activity Standing balance-Leahy Scale: Poor Standing balance comment: pt does not fully extend his hips to stand                             Pertinent Vitals/Pain Pain Assessment: No/denies pain    Home Living Family/patient expects to be discharged to:: Assisted living               Home Equipment: Walker - 2 wheels;Cane - single point;Shower seat Additional Comments: Pt was on [facility] for years, and in last week transitioned from increased weakness to RW then pt had to be assisted directly by wife    Prior Function Level of Independence: Needs assistance   Gait / Transfers Assistance Needed: RW or SPC in recent past, wife has assisted  ADL's / Homemaking Assistance Needed: wife assists with bathing and dressing        Hand Dominance   Dominant Hand: Right    Extremity/Trunk Assessment   Upper Extremity Assessment Upper Extremity Assessment: Overall WFL for tasks assessed    Lower Extremity  Assessment Lower Extremity Assessment: Generalized weakness    Cervical / Trunk Assessment Cervical / Trunk Assessment: Kyphotic  Communication   Communication: HOH  Cognition Arousal/Alertness: Awake/alert;Lethargic Behavior During Therapy: Flat affect Overall Cognitive Status: Impaired/Different from baseline                                 General Comments: pt is less focused on mobility than previously      General Comments      Exercises     Assessment/Plan    PT Assessment Patient needs continued PT services  PT Problem List Decreased strength;Decreased range of motion;Decreased  activity tolerance;Decreased balance;Decreased mobility;Decreased coordination;Decreased knowledge of use of DME;Decreased safety awareness;Decreased cognition       PT Treatment Interventions DME instruction;Gait training;Functional mobility training;Therapeutic activities;Therapeutic exercise;Balance training;Neuromuscular re-education;Patient/family education    PT Goals (Current goals can be found in the Care Plan section)  Acute Rehab PT Goals Patient Stated Goal: none stated PT Goal Formulation: With patient/family Time For Goal Achievement: 03/19/18 Potential to Achieve Goals: Good    Frequency Min 2X/week   Barriers to discharge Decreased caregiver support pt is 2 person assist to get up and wife is home alone    Co-evaluation               AM-PAC PT "6 Clicks" Mobility  Outcome Measure Help needed turning from your back to your side while in a flat bed without using bedrails?: A Little Help needed moving from lying on your back to sitting on the side of a flat bed without using bedrails?: A Lot Help needed moving to and from a bed to a chair (including a wheelchair)?: A Lot Help needed standing up from a chair using your arms (e.g., wheelchair or bedside chair)?: A Lot Help needed to walk in hospital room?: Total Help needed climbing 3-5 steps with a railing? : Total 6 Click Score: 11    End of Session Equipment Utilized During Treatment: Gait belt Activity Tolerance: Patient tolerated treatment well;Patient limited by fatigue;Treatment limited secondary to medical complications (Comment) Patient left: in bed;with call bell/phone within reach;with bed alarm set;with family/visitor present Nurse Communication: Mobility status PT Visit Diagnosis: Unsteadiness on feet (R26.81);Muscle weakness (generalized) (M62.81);Difficulty in walking, not elsewhere classified (R26.2)    Time: 2395-3202 PT Time Calculation (min) (ACUTE ONLY): 41 min   Charges:   PT  Evaluation $PT Eval Moderate Complexity: 1 Mod PT Treatments $Therapeutic Exercise: 8-22 mins $Therapeutic Activity: 8-22 mins       Ivar Drape 03/05/2018, 5:53 PM   Samul Dada, PT MS Acute Rehab Dept. Number: [facility] [id number] and [facility] [phone]

## 2018-03-05 NOTE — ED Provider Notes (Signed)
[facility] Emergency Department Provider Note    First MD Initiated Contact with Patient 03/05/18 [phone]     (approximate)  I have reviewed the triage vital signs and the nursing notes.   HISTORY History and physical exam limited secondary to altered mental status.   Chief Complaint Altered Mental Status    HPI Darrell Fry is a 85 y.o. male with below list of chronic medical conditions including cerebral amyloid angiopathic and Parkinson's disease returns to the emergency department following evaluation today secondary to altered mental status and generalized weakness.  EMS states that the patient's wife states that the patient has progressively become more altered and weak since returning from the emergency department.        Past Medical History:  Diagnosis Date  . Arthritis    knees  . Cerebral amyloid angiopathy (HCC)   . Parkinson's disease (HCC)    no meds  . Parkinson's disease dementia (HCC)   . REM sleep behavior disorder   . Wears hearing aid in both ears     Patient Active Problem List   Diagnosis Date Noted  . AMS (altered mental status) 03/05/2018    Past Surgical History:  Procedure Laterality Date  . CATARACT EXTRACTION W/PHACO Left 07/18/2017   Procedure: CATARACT EXTRACTION PHACO AND INTRAOCULAR LENS PLACEMENT (IOC) IVA TOPICAL  LEFT;  Surgeon: Lockie Mola, MD;  Location: [facility];  Service: Ophthalmology;  Laterality: Left;  . CATARACT EXTRACTION W/PHACO Right 08/15/2017   Procedure: CATARACT EXTRACTION PHACO AND INTRAOCULAR LENS PLACEMENT (IOC) RIGHT;  Surgeon: Lockie Mola, MD;  Location: [facility];  Service: Ophthalmology;  Laterality: Right;  . COLONOSCOPY    . FINGER SURGERY     no anesthesia    Prior to Admission medications   Medication Sig Start Date End Date Taking? Authorizing Provider  ALLOPURINOL PO Take 300 mg by mouth daily.    Yes [provider]  Apoaequorin  (PREVAGEN PO) Take 1 tablet by mouth daily.    Yes [provider]  cholecalciferol (VITAMIN D) 25 MCG (1000 UT) tablet Take 1 tablet by mouth daily.    Yes [provider]  Glucosamine HCl (GLUCOSAMINE PO) Take 1 tablet by mouth 2 (two) times daily.    Yes [provider]  Multiple Vitamins-Minerals (PRESERVISION AREDS 2 PO) Take 1 tablet by mouth 2 (two) times daily.    Yes [provider]  timolol (BETIMOL) 0.25 % ophthalmic solution Place 1-2 drops into both eyes daily.    Yes [provider]    Allergies Patient has no known allergies.  History reviewed. No pertinent family history.  Social History Social History   Tobacco Use  . Smoking status: Former Games developer  . Smokeless tobacco: Never Used  . Tobacco comment: quit in 1970s  Substance Use Topics  . Alcohol use: Yes    Alcohol/week: 5.0 standard drinks    Types: 5 Cans of beer per week  . Drug use: Not on file    Review of Systems Constitutional: No fever/chills Eyes: No visual changes. ENT: No sore throat. Cardiovascular: Denies chest pain. Respiratory: Denies shortness of breath. Gastrointestinal: No abdominal pain.  No nausea, no vomiting.  No diarrhea.  No constipation. Genitourinary: Negative for dysuria. Musculoskeletal: Negative for neck pain.  Negative for back pain. Integumentary: Negative for rash. Neurological: Negative for headaches, focal weakness or numbness.   ____________________________________________   PHYSICAL EXAM:  VITAL SIGNS: ED Triage Vitals  Enc Vitals Group  BP 03/05/18 0053 (!) 181/96     Pulse Rate 03/05/18 0053 76     Resp 03/05/18 0053 20     Temp 03/05/18 0053 98 F (36.7 C)     Temp Source 03/05/18 0053 Oral     SpO2 03/05/18 0053 100 %     Weight 03/05/18 0052 71.7 kg (158 lb 1.1 oz)     Height 03/05/18 0452 1.651 m (5\' 5" )     Head Circumference --      Peak Flow --      Pain Score 03/05/18 0052 0     Pain Loc --       Pain Edu? --      Excl. in GC? --     Constitutional: Alert and . Well appearing and in no acute distress. Eyes: Conjunctivae are normal. {**PERRL. EOMI. Head: Atraumatic. Mouth/Throat: Mucous membranes are moist.  Oropharynx non-erythematous. Neck: No stridor.  No meningeal signs.  Cardiovascular: Normal rate, regular rhythm. Good peripheral circulation. Grossly normal heart sounds. Respiratory: Normal respiratory effort.  No retractions. Lungs CTAB. Gastrointestinal: Soft and nontender. No distention.  Musculoskeletal: No lower extremity tenderness nor edema. No gross deformities of extremities. Neurologic:   No gross focal neurologic deficits are appreciated.  Skin:  Skin is warm, dry and intact. No rash noted.   ____________________________________________   LABS (all labs ordered are listed, but only abnormal results are displayed)  Labs Reviewed  URINALYSIS, COMPLETE (UACMP) WITH MICROSCOPIC - Abnormal; Notable for the following components:      Result Value   Color, Urine YELLOW (*)    APPearance CLEAR (*)    Hgb urine dipstick MODERATE (*)    Protein, ur 30 (*)    RBC / HPF >50 (*)    Bacteria, UA RARE (*)    All other components within normal limits  CBC - Abnormal; Notable for the following components:   RBC 3.80 (*)    Hemoglobin 12.1 (*)    HCT 36.6 (*)    Platelets 144 (*)    All other components within normal limits  COMPREHENSIVE METABOLIC PANEL - Abnormal; Notable for the following components:   Glucose, Bld 106 (*)    BUN 26 (*)    Calcium 11.4 (*)    All other components within normal limits  PHOSPHORUS - Abnormal; Notable for the following components:   Phosphorus 2.4 (*)    All other components within normal limits  MAGNESIUM  TSH  VITAMIN B12  FOLATE  AMMONIA  RAPID HIV SCREEN (HIV 1/2 AB+AG)  PROCALCITONIN  RPR  VITAMIN D 25 HYDROXY (VIT D DEFICIENCY, FRACTURES)  CALCIUM, IONIZED  PARATHYROID HORMONE, INTACT (NO CA)    ____________________________________________  EKG  ED ECG REPORT I, Justice N Sharvi Mooneyhan, the attending physician, personally viewed and interpreted this ECG.   Date: 03/05/2018  EKG Time: 12:53 AM  Rate: 78  Rhythm: Normal sinus rhythm with right bundle branch block  Axis: Normal  Intervals: Normal  ST&T Change: None  ____________________________________________  RADIOLOGY I, [place] Dewayne Shorter, personally viewed and evaluated these images (plain radiographs) as part of my medical decision making, as well as reviewing the written report by the radiologist.  ED MD interpretation:    Official radiology report(s): US Renal  Result Date: 03/05/2018 CLINICAL DATA:  85 year old presenting with microscopic hematuria. EXAM: RENAL / URINARY TRACT ULTRASOUND COMPLETE COMPARISON:  None. FINDINGS: Right Kidney: Renal measurements: Approximately 11.3 x 5.7 x 7.2 cm = volume: 245 mL. Well-preserved cortex for  patient age. Normal parenchymal echotexture. No hydronephrosis. Approximate 6.9 x 8.2 x 7.9 cm benign cyst arising from the mid kidney. No visible solid renal masses. Left Kidney: Renal measurements: Approximately 12.0 x 6.0 x 5.6 cm = volume: 211 mL. Well-preserved cortex for patient age. Normal parenchymal echotexture. No hydronephrosis. Approximate 4.0 x 2.4 x 2.2 cm benign cyst arising from the mid to upper kidney. No visible solid renal masses. Bladder: Approximate 2.1 x 2.0 x 2.2 cm mass posteriorly within the bladder IMPRESSION: 1. Benign cysts involving both kidneys. No significant abnormalities involving either kidney. 2. Possible 2 cm mass involving the POSTERIOR wall of the urinary bladder (favored over a small hematoma in the dependent portion of the bladder). Electronically Signed   By: Hulan Saas M.D.   On: 03/05/2018 12:01      Procedures   ____________________________________________   INITIAL IMPRESSION / MDM / ASSESSMENT AND PLAN / ED COURSE  As part of my medical  decision making, I reviewed the following data within the electronic MEDICAL RECORD NUMBER   85 year old male presented with above-stated history and physical exam secondary to altered mental status that was previously evaluated in the emergency department yesterday.  Laboratory data notable for elevated calcium of 11.4 urinalysis revealed 11-20 WBCs with rare bacteria and hyaline casts as well as hematuria.  Renal ultrasound performed revealed possible 2 cm mass post anterior wall of the urinary bladder.  Patient discussed with Dr. Valetta Close for further evaluation and management    ____________________________________________  FINAL CLINICAL IMPRESSION(S) / ED DIAGNOSES  Final diagnoses:  Hematuria, microscopic  Hypercalcemia Altered mental status   MEDICATIONS GIVEN DURING THIS VISIT:  Medications  senna-docusate (Senokot-S) tablet 1 tablet (has no administration in time range)  bisacodyl (DULCOLAX) EC tablet 5 mg (has no administration in time range)  ondansetron (ZOFRAN) tablet 4 mg (has no administration in time range)    Or  ondansetron (ZOFRAN) injection 4 mg (has no administration in time range)  enoxaparin (LOVENOX) injection 40 mg (has no administration in time range)  acetaminophen (TYLENOL) tablet 650 mg (has no administration in time range)    Or  acetaminophen (TYLENOL) suppository 650 mg (has no administration in time range)  timolol (BETIMOL) 0.25 % ophthalmic solution 1 drop (has no administration in time range)  allopurinol (ZYLOPRIM) tablet 300 mg (300 mg Oral Given 03/05/18 1407)  tamsulosin (FLOMAX) capsule 0.4 mg (0.4 mg Oral Given 03/05/18 1408)  MEDLINE mouth rinse (has no administration in time range)     ED Discharge Orders    None       Note:  This document was prepared using Dragon voice recognition software and may include unintentional dictation errors.   Darci Current, MD 03/05/18 [phone]

## 2018-03-06 DIAGNOSIS — A419 Sepsis, unspecified organism: Secondary | ICD-10-CM | POA: Diagnosis not present

## 2018-03-06 DIAGNOSIS — R531 Weakness: Secondary | ICD-10-CM | POA: Diagnosis not present

## 2018-03-06 LAB — CALCIUM, IONIZED: Calcium, Ionized, Serum: 6.5 mg/dL — ABNORMAL HIGH (ref 4.5–5.6)

## 2018-03-06 LAB — RPR: RPR Ser Ql: NONREACTIVE

## 2018-03-06 LAB — PARATHYROID HORMONE, INTACT (NO CA): PTH: 7 pg/mL — ABNORMAL LOW (ref 15–65)

## 2018-03-06 LAB — VITAMIN D 25 HYDROXY (VIT D DEFICIENCY, FRACTURES): Vit D, 25-Hydroxy: 29.3 ng/mL — ABNORMAL LOW (ref 30.0–100.0)

## 2018-03-06 MED ORDER — TAMSULOSIN HCL 0.4 MG PO CAPS: 0.4000 mg | ORAL_CAPSULE | Freq: Every day | ORAL | 0 refills | Status: DC

## 2018-03-06 NOTE — Progress Notes (Signed)
OT Cancellation Note  Patient Details Name: Darrell Fry MRN: [id number] DOB: [birth year]   Cancelled Treatment:    Reason Eval/Treat Not Completed: Other (comment). Consult received, chart reviewed. Pt in process of discharging, unavailable for OT evaluation. Will re-attempt as able.   Richrd Prime, MPH, MS, OTR/L ascom [phone] 03/06/18, 12:24 PM

## 2018-03-06 NOTE — Clinical Social Work Note (Signed)
Clinical Social Work Assessment  Patient Details  Name: Darrell Fry MRN: [id number] Date of Birth: 11-29-1927  Date of referral:  03/06/18               Reason for consult:  Facility Placement                Permission sought to share information with:  Case Manager, Customer service manager, Family Supports Permission granted to share information::  Yes, Verbal Permission Granted  Name::        Agency::     Relationship::     Contact Information:     Housing/Transportation Living arrangements for the past 2 months:  [place] of Information:  Spouse Patient Interpreter Needed:  None Criminal Activity/Legal Involvement Pertinent to Current Situation/Hospitalization:  No - Comment as needed Significant Relationships:  Adult Children, Spouse Lives with:  Spouse Do you feel safe going back to the place where you live?  Yes Need for family participation in patient care:  Yes (Comment)  Care giving concerns:  Patient lives at [facility] [place] [facility] living    Social Worker assessment / plan:  CSW consulted for SNF placement. CSW met with patient and wife at bedside. Wife states that patient lives with her at [place] living. CSW explained PT recommendation of SNF. Wife states that she would like patient to go to [facility]. CSW explained that patient has not had a Medicare qualifying stay and would have to use his respite days. Wife states that she is accepting of that. Patient will be discharged today and family in agreement. CSW notified Seth Bake at [facility] of discharge today. Patient will be transported by EMS. RN to call report and call for transport.   Employment status:  Retired Forensic scientist:  [employer] PT Recommendations:  [place] / Referral to community resources:  [place]  Patient/Family's Response to care:  Wife thanked CSW for assistance   Patient/Family's Understanding  of and Emotional Response to Diagnosis, Current Treatment, and Prognosis:  Wife in agreement with discharge plan   Emotional Assessment Appearance:  Appears stated age Attitude/Demeanor/Rapport:  Lethargic Affect (typically observed):    Orientation:  Oriented to Self, Oriented to Place Alcohol / Substance use:  Not Applicable Psych involvement (Current and /or in the community):  No (Comment)  Discharge Needs  Concerns to be addressed:  Discharge Planning Concerns Readmission within the last 30 days:  No Current discharge risk:  None Barriers to Discharge:  Continued Medical Work up   [employer], [place] 03/06/2018, 11:06 AM

## 2018-03-06 NOTE — NC FL2 (Signed)
[place] MEDICAID FL2 LEVEL OF CARE SCREENING TOOL     IDENTIFICATION  Patient Name: Darrell Fry Birthdate: Aug 20, 1927 Sex: male Admission Date (Current Location): 03/05/2018  [place] and [place] Number:  Chiropodist and Address:  [facility], [address] [place], [place] [address]      Provider Number: [id number]  Attending Physician Name and Address:  Adrian Saran, MD  Relative Name and Phone Number:       Current Level of Care: Hospital Recommended Level of Care: Skilled Nursing Facility Prior Approval Number:    Date Approved/Denied:   PASRR Number: [id number]  Discharge Plan: SNF    Current Diagnoses: Patient Active Problem List   Diagnosis Date Noted  . AMS (altered mental status) 03/05/2018    Orientation RESPIRATION BLADDER Height & Weight     Self, Place  Normal Continent Weight: 155 lb 10.3 oz (70.6 kg) Height:  5\' 5"  (165.1 cm)  BEHAVIORAL SYMPTOMS/MOOD NEUROLOGICAL BOWEL NUTRITION STATUS  (none) (none) Incontinent Diet(Dysphagia 3 )  AMBULATORY STATUS COMMUNICATION OF NEEDS Skin   Extensive Assist Verbally Normal                       Personal Care Assistance Level of Assistance  Bathing, Feeding, Dressing Bathing Assistance: Limited assistance Feeding assistance: Independent Dressing Assistance: Limited assistance     Functional Limitations Info  Sight, Hearing, Speech Sight Info: Adequate Hearing Info: Adequate Speech Info: Adequate    SPECIAL CARE FACTORS FREQUENCY  PT (By licensed PT), OT (By licensed OT)                    Contractures Contractures Info: Not present    Additional Factors Info  Code Status, Allergies Code Status Info: Full Code  Allergies Info: NKA           Current Medications (03/06/2018):  This is the current hospital active medication list Current Facility-Administered Medications  Medication Dose Route Frequency Provider Last Rate Last Dose  .  acetaminophen (TYLENOL) tablet 650 mg  650 mg Oral Q6H PRN Barbaraann Rondo, MD       Or  . acetaminophen (TYLENOL) suppository 650 mg  650 mg Rectal Q6H PRN Barbaraann Rondo, MD      . allopurinol (ZYLOPRIM) tablet 300 mg  300 mg Oral Daily Barbaraann Rondo, MD   300 mg at 03/05/18 1407  . bisacodyl (DULCOLAX) EC tablet 5 mg  5 mg Oral Daily PRN Barbaraann Rondo, MD      . enoxaparin (LOVENOX) injection 40 mg  40 mg Subcutaneous Q24H Barbaraann Rondo, MD   40 mg at 03/06/18 0219  . MEDLINE mouth rinse  15 mL Mouth Rinse BID Salary, Montell D, MD   15 mL at 03/06/18 0222  . ondansetron (ZOFRAN) tablet 4 mg  4 mg Oral Q6H PRN Barbaraann Rondo, MD       Or  . ondansetron (ZOFRAN) injection 4 mg  4 mg Intravenous Q6H PRN Barbaraann Rondo, MD      . senna-docusate (Senokot-S) tablet 1 tablet  1 tablet Oral QHS PRN Barbaraann Rondo, MD      . tamsulosin (FLOMAX) capsule 0.4 mg  0.4 mg Oral Daily Salary, Montell D, MD   0.4 mg at 03/05/18 1408  . timolol (BETIMOL) 0.25 % ophthalmic solution 1 drop  1 drop Both Eyes Daily Barbaraann Rondo, MD         Discharge Medications: Please see discharge summary for a list  of discharge medications.  Relevant Imaging Results:  Relevant Lab Results:   Additional Information SSN: [id number]  Ruthe Mannan, [place]

## 2018-03-06 NOTE — Plan of Care (Signed)
  Problem: Clinical Measurements: Goal: Ability to maintain clinical measurements within normal limits will improve Outcome: Progressing Goal: Will remain free from infection Outcome: Progressing Goal: Diagnostic test results will improve Outcome: Progressing Goal: Respiratory complications will improve Outcome: Progressing Goal: Cardiovascular complication will be avoided Outcome: Progressing   Problem: Nutrition: Goal: Adequate nutrition will be maintained Outcome: Progressing   Problem: Coping: Goal: Level of anxiety will decrease Outcome: Progressing   Problem: Elimination: Goal: Will not experience complications related to bowel motility Outcome: Progressing   Problem: Pain Managment: Goal: General experience of comfort will improve Outcome: Progressing   Problem: Safety: Goal: Ability to remain free from injury will improve Outcome: Progressing   

## 2018-03-06 NOTE — Progress Notes (Signed)
PT Cancellation Note  Patient Details Name: Darrell Fry MRN: [id number] DOB: May 29, 1927   Cancelled Treatment:    Reason Eval/Treat Not Completed: Fatigue/lethargy limiting ability to participate.  Attempted to move pt as agreed upon by the NP but pt is asleep and was given meds due to being up all night.  Going to healthcare at [facility].  Will expect dc for now.   Ivar Drape 03/06/2018, 12:28 PM  Samul Dada, PT MS Acute Rehab Dept. Number: [facility] [id number] and [facility] [phone]

## 2018-03-06 NOTE — Progress Notes (Signed)
Patient discharged to [facility] per MD order. Report called to Elnita Maxwell RN at facility. EMS called for transport.

## 2018-03-06 NOTE — Discharge Summary (Signed)
[facility] [place] [facility]   PATIENT NAME: Darrell Fry    MR#:  [id number]  DATE OF BIRTH:  1927/09/30  DATE OF ADMISSION:  03/05/2018 ADMITTING PHYSICIAN: Barbaraann Rondo, MD  DATE OF DISCHARGE: 03/06/2018  PRIMARY CARE PHYSICIAN: Marisue Ivan, MD    ADMISSION DIAGNOSIS:  Hematuria, microscopic [R31.29]  DISCHARGE DIAGNOSIS:  Active Problems:   AMS (altered mental status) progressive parkinson's disease  SECONDARY DIAGNOSIS:   Past Medical History:  Diagnosis Date  . Arthritis    knees  . Cerebral amyloid angiopathy (HCC)   . Parkinson's disease (HCC)    no meds  . Parkinson's disease dementia (HCC)   . REM sleep behavior disorder   . Wears hearing aid in both ears     HOSPITAL COURSE:  85 year old male with history of Parkinson's disease and cerebral amyloid angiopathy Who presented from [facility] due to generalized weakness and confusion.  1.  Acute encephalopathy in the setting of progressive neurodegenerative disease: Patient was evaluated by neurology while in the hospital. Stroke work-up was essentially unremarkable.  Initially they were plans to start Parkinson's medications however after only receiving 1 dose patient's wife did not want further treatment as he remained lethargic. He will be off of all Parkinson's medications. He will follow-up with his neurologist 2.  Cerebral amyloid angiopathy: Patient should not take any antiplatelets as per neurology  3  BPH: Continue Flomax  Patient needs outpatient palliative care services. DISCHARGE CONDITIONS AND DIET:   Guarded Stage III diet with aspiration precautions  CONSULTS OBTAINED:  Treatment Team:  Barbaraann Rondo, MD Pauletta Browns, MD  DRUG ALLERGIES:  No Known Allergies  DISCHARGE MEDICATIONS:   Allergies as of 03/06/2018   No Known Allergies     Medication List    TAKE these medications   ALLOPURINOL PO Take 300 mg by mouth daily.    cholecalciferol 25 MCG (1000 UT) tablet Commonly known as:  VITAMIN D Take 1 tablet by mouth daily.   GLUCOSAMINE PO Take 1 tablet by mouth 2 (two) times daily.   PRESERVISION AREDS 2 PO Take 1 tablet by mouth 2 (two) times daily.   PREVAGEN PO Take 1 tablet by mouth daily.   tamsulosin 0.4 MG Caps capsule Commonly known as:  FLOMAX Take 1 capsule (0.4 mg total) by mouth daily.   timolol 0.25 % ophthalmic solution Commonly known as:  BETIMOL Place 1-2 drops into both eyes daily.         Today   CHIEF COMPLAINT:  Wife at bedside No acute issues  Does not want meds for PDx   VITAL SIGNS:  Blood pressure (!) 110/92, pulse 63, temperature 99.1 F (37.3 C), temperature source Axillary, resp. rate 16, height  (1.651 m), weight 70.6 kg, SpO2 97 %.   REVIEW OF SYSTEMS:  Review of Systems  Unable to perform ROS: Acuity of condition     PHYSICAL EXAMINATION:  GENERAL:  85 y.o.-year-old patient lying in the bed with no acute distress. Ill appearing NECK:  Supple, no jugular venous distention. No thyroid enlargement, no tenderness.  LUNGS: Normal breath sounds bilaterally, no wheezing, rales,rhonchi  No use of accessory muscles of respiration.  CARDIOVASCULAR: S1, S2 normal. No murmurs, rubs, or gallops.  ABDOMEN: Soft, non-tender, non-distended. Bowel sounds present. No organomegaly or mass.  EXTREMITIES: No pedal edema, cyanosis, or clubbing.  PSYCHIATRIC: The patient is sleeping SKIN: No obvious rash, lesion, or ulcer.   DATA REVIEW:   CBC Recent Labs  Lab 03/05/18 0058  WBC 9.6  HGB 12.1*  HCT 36.6*  PLT 144*    Chemistries  Recent Labs  Lab 03/05/18 0058 03/05/18 0422  NA 140  --   K 4.0  --   CL 105  --   CO2 27  --   GLUCOSE 106*  --   BUN 26*  --   CREATININE 0.73  --   CALCIUM 11.4*  --   MG  --  2.1  AST 37  --   ALT 34  --   ALKPHOS 99  --   BILITOT 0.8  --     Cardiac Enzymes No results for input(s): TROPONINI in the last  168 hours.  Microbiology Results  @MICRORSLT48 @  RADIOLOGY:  Ct Head Wo Contrast  Result Date: 03/04/2018 CLINICAL DATA:  Speech difficulty.  Possible stroke. EXAM: CT HEAD WITHOUT CONTRAST TECHNIQUE: Contiguous axial images were obtained from the base of the skull through the vertex without intravenous contrast. COMPARISON:  MRI 09/27/2017 FINDINGS: Brain: No acute intracranial abnormality. Specifically, no hemorrhage, hydrocephalus, mass lesion, acute infarction, or significant intracranial injury. Vascular: No hyperdense vessel or unexpected calcification. Skull: No acute calvarial abnormality. Sinuses/Orbits: Visualized paranasal sinuses and mastoids clear. Orbital soft tissues unremarkable. Other: none IMPRESSION: Atrophy, chronic microvascular disease. No acute intracranial abnormality. Electronically Signed   By: Charlett Nose M.D.   On: 03/04/2018 12:05   Mr Brain Wo Contrast  Result Date: 03/04/2018 CLINICAL DATA:  Slurred speech and altered mental status today. History of Parkinson's disease. EXAM: MRI HEAD WITHOUT CONTRAST TECHNIQUE: Multiplanar, multiecho pulse sequences of the brain and surrounding structures were obtained without intravenous contrast. COMPARISON:  CT HEAD March 04, 2018 and MRI of the head September 27, 2016 FINDINGS: INTRACRANIAL CONTENTS: No reduced diffusion to suggest acute ischemia. Increased number of supra and infratentorial chronic microhemorrhages with disproportion and posterior distribution. Moderate to severe parenchymal brain volume loss. No hydrocephalus. Patchy supratentorial white matter FLAIR T2 hyperintensities. No midline shift, mass effect or masses. No abnormal extra-axial fluid collections. VASCULAR: Normal major intracranial vascular flow voids present at skull base. SKULL AND UPPER CERVICAL SPINE: No abnormal sellar expansion. 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 No suspicious calvarial bone marrow signal. Craniocervical junction  maintained. SINUSES/ORBITS: Mild paranasal sinus mucosal thickening. Mastoid air cells are well aerated. Status post bilateral ocular lens implants.The included ocular globes and orbital contents are non-suspicious. OTHER: None. IMPRESSION: 1. No acute intracranial process. 2. Increased number chronic microhemorrhages associated with amyloid angiopathy. 3. Mild chronic small vessel ischemic changes moderate to severe parenchymal brain volume loss. Electronically Signed   By: Awilda Metro M.D.   On: 03/04/2018 17:01   US Renal  Result Date: 03/05/2018 CLINICAL DATA:  85 year old presenting with microscopic hematuria. EXAM: RENAL / URINARY TRACT ULTRASOUND COMPLETE COMPARISON:  None. FINDINGS: Right Kidney: Renal measurements: Approximately 11.3 x 5.7 x 7.2 cm = volume: 245 mL. Well-preserved cortex for patient age. Normal parenchymal echotexture. No hydronephrosis. Approximate 6.9 x 8.2 x 7.9 cm benign cyst arising from the mid kidney. No visible solid renal masses. Left Kidney: Renal measurements: Approximately 12.0 x 6.0 x 5.6 cm = volume: 211 mL. Well-preserved cortex for patient age. Normal parenchymal echotexture. No hydronephrosis. Approximate 4.0 x 2.4 x 2.2 cm benign cyst arising from the mid to upper kidney. No visible solid renal masses. Bladder: Approximate 2.1 x 2.0 x 2.2 cm mass posteriorly within the bladder IMPRESSION: 1. Benign cysts involving both kidneys. No significant abnormalities involving  either kidney. 2. Possible 2 cm mass involving the POSTERIOR wall of the urinary bladder (favored over a small hematoma in the dependent portion of the bladder). Electronically Signed   By: Hulan Saas M.D.   On: 03/05/2018 12:01   Dg Abd 2 Views  Result Date: 03/04/2018 CLINICAL DATA:  Diarrhea. EXAM: ABDOMEN - 2 VIEW COMPARISON:  None. FINDINGS: Normal bowel gas pattern without free peritoneal air. Arterial calcifications in the left upper quadrant of the abdomen. Left pelvic phleboliths.  Extensive lumbar and lower thoracic spine degenerative changes with mild dextroconvex scoliosis. IMPRESSION: No acute abnormality. Electronically Signed   By: Beckie Salts M.D.   On: 03/04/2018 18:05      Allergies as of 03/06/2018   No Known Allergies     Medication List    TAKE these medications   ALLOPURINOL PO Take 300 mg by mouth daily.   cholecalciferol 25 MCG (1000 UT) tablet Commonly known as:  VITAMIN D Take 1 tablet by mouth daily.   GLUCOSAMINE PO Take 1 tablet by mouth 2 (two) times daily.   PRESERVISION AREDS 2 PO Take 1 tablet by mouth 2 (two) times daily.   PREVAGEN PO Take 1 tablet by mouth daily.   tamsulosin 0.4 MG Caps capsule Commonly known as:  FLOMAX Take 1 capsule (0.4 mg total) by mouth daily.   timolol 0.25 % ophthalmic solution Commonly known as:  BETIMOL Place 1-2 drops into both eyes daily.           Management plans discussed with the patient's wife and she is in agreement. Stable for discharge   Patient should follow up with Dr Sherryll Burger neurology  CODE STATUS:     Code Status Orders  (From admission, onward)         Start     Ordered   03/05/18 0442  Full code  Continuous     03/05/18 0441        Code Status History    This patient has a current code status but no historical code status.      TOTAL TIME TAKING CARE OF THIS PATIENT: 38 minutes.    Note: This dictation was prepared with Dragon dictation along with smaller phrase technology. Any transcriptional errors that result from this process are unintentional.  Latisha Lasch M.D on 03/06/2018 at 10:21 AM  Between 7am to 6pm - Pager - [phone] After 6pm go to [email or web address] - Social research officer, government  [facility]  Office  [phone]  CC: Primary care physician; Marisue Ivan, MD

## 2018-03-07 DIAGNOSIS — R4182 Altered mental status, unspecified: Secondary | ICD-10-CM | POA: Diagnosis not present

## 2018-03-07 DIAGNOSIS — N401 Enlarged prostate with lower urinary tract symptoms: Secondary | ICD-10-CM | POA: Diagnosis not present

## 2018-03-07 DIAGNOSIS — G20 Parkinson's disease: Secondary | ICD-10-CM

## 2018-03-09 ENCOUNTER — Emergency Department: Payer: Medicare Other

## 2018-03-09 ENCOUNTER — Inpatient Hospital Stay
Admission: EM | Admit: 2018-03-09 | Discharge: 2018-03-12 | DRG: 871 | Disposition: A | Discharge status: Hospice | Payer: Medicare Other | Source: Skilled Nursing Facility | Attending: Internal Medicine | Admitting: Internal Medicine

## 2018-03-09 ENCOUNTER — Other Ambulatory Visit: Payer: Self-pay

## 2018-03-09 DIAGNOSIS — Z66 Do not resuscitate: Secondary | ICD-10-CM | POA: Diagnosis present

## 2018-03-09 DIAGNOSIS — Z79899 Other long term (current) drug therapy: Secondary | ICD-10-CM | POA: Diagnosis not present

## 2018-03-09 DIAGNOSIS — J690 Pneumonitis due to inhalation of food and vomit: Secondary | ICD-10-CM | POA: Diagnosis present

## 2018-03-09 DIAGNOSIS — Z515 Encounter for palliative care: Secondary | ICD-10-CM | POA: Diagnosis not present

## 2018-03-09 DIAGNOSIS — R0603 Acute respiratory distress: Secondary | ICD-10-CM

## 2018-03-09 DIAGNOSIS — J9601 Acute respiratory failure with hypoxia: Secondary | ICD-10-CM | POA: Diagnosis present

## 2018-03-09 DIAGNOSIS — R140 Abdominal distension (gaseous): Secondary | ICD-10-CM | POA: Diagnosis present

## 2018-03-09 DIAGNOSIS — F0280 Dementia in other diseases classified elsewhere without behavioral disturbance: Secondary | ICD-10-CM | POA: Diagnosis present

## 2018-03-09 DIAGNOSIS — G20 Parkinson's disease: Secondary | ICD-10-CM | POA: Diagnosis present

## 2018-03-09 DIAGNOSIS — J9600 Acute respiratory failure, unspecified whether with hypoxia or hypercapnia: Secondary | ICD-10-CM | POA: Diagnosis present

## 2018-03-09 DIAGNOSIS — R6520 Severe sepsis without septic shock: Secondary | ICD-10-CM | POA: Diagnosis present

## 2018-03-09 DIAGNOSIS — Y95 Nosocomial condition: Secondary | ICD-10-CM | POA: Diagnosis present

## 2018-03-09 DIAGNOSIS — G9341 Metabolic encephalopathy: Secondary | ICD-10-CM | POA: Diagnosis present

## 2018-03-09 DIAGNOSIS — G4752 REM sleep behavior disorder: Secondary | ICD-10-CM | POA: Diagnosis present

## 2018-03-09 DIAGNOSIS — R339 Retention of urine, unspecified: Secondary | ICD-10-CM | POA: Diagnosis present

## 2018-03-09 DIAGNOSIS — I680 Cerebral amyloid angiopathy: Secondary | ICD-10-CM | POA: Diagnosis present

## 2018-03-09 DIAGNOSIS — J189 Pneumonia, unspecified organism: Secondary | ICD-10-CM | POA: Diagnosis present

## 2018-03-09 DIAGNOSIS — Z87891 Personal history of nicotine dependence: Secondary | ICD-10-CM | POA: Diagnosis not present

## 2018-03-09 DIAGNOSIS — A419 Sepsis, unspecified organism: Principal | ICD-10-CM | POA: Diagnosis present

## 2018-03-09 DIAGNOSIS — Z974 Presence of external hearing-aid: Secondary | ICD-10-CM

## 2018-03-09 DIAGNOSIS — I10 Essential (primary) hypertension: Secondary | ICD-10-CM | POA: Diagnosis present

## 2018-03-09 DIAGNOSIS — M1990 Unspecified osteoarthritis, unspecified site: Secondary | ICD-10-CM | POA: Diagnosis present

## 2018-03-09 DIAGNOSIS — R531 Weakness: Secondary | ICD-10-CM | POA: Diagnosis present

## 2018-03-09 LAB — BLOOD GAS, ARTERIAL
Acid-Base Excess: 3.7 mmol/L — ABNORMAL HIGH (ref 0.0–2.0)
Allens test (pass/fail): POSITIVE — AB
Bicarbonate: 27.7 mmol/L (ref 20.0–28.0)
FIO2: 1
O2 Saturation: 99.7 %
Patient temperature: 37
pCO2 arterial: 39 mmHg (ref 32.0–48.0)
pH, Arterial: 7.46 — ABNORMAL HIGH (ref 7.350–7.450)
pO2, Arterial: 195 mmHg — ABNORMAL HIGH (ref 83.0–108.0)

## 2018-03-09 LAB — CBC
HCT: 34.5 % — ABNORMAL LOW (ref 39.0–52.0)
HEMOGLOBIN: 10.8 g/dL — AB (ref 13.0–17.0)
MCH: 30.9 pg (ref 26.0–34.0)
MCHC: 31.3 g/dL (ref 30.0–36.0)
MCV: 98.9 fL (ref 80.0–100.0)
Platelets: 123 10*3/uL — ABNORMAL LOW (ref 150–400)
RBC: 3.49 MIL/uL — ABNORMAL LOW (ref 4.22–5.81)
RDW: 13.9 % (ref 11.5–15.5)
WBC: 15.3 10*3/uL — ABNORMAL HIGH (ref 4.0–10.5)
nRBC: 0 % (ref 0.0–0.2)

## 2018-03-09 LAB — URINALYSIS, COMPLETE (UACMP) WITH MICROSCOPIC
BILIRUBIN URINE: NEGATIVE
Bacteria, UA: NONE SEEN
Glucose, UA: NEGATIVE mg/dL
Ketones, ur: NEGATIVE mg/dL
Leukocytes,Ua: NEGATIVE
Nitrite: NEGATIVE
PH: 5 (ref 5.0–8.0)
Protein, ur: 30 mg/dL — AB
Specific Gravity, Urine: 1.019 (ref 1.005–1.030)
Squamous Epithelial / HPF: NONE SEEN (ref 0–5)

## 2018-03-09 LAB — INFLUENZA PANEL BY PCR (TYPE A & B)
Influenza A By PCR: NEGATIVE
Influenza B By PCR: NEGATIVE

## 2018-03-09 LAB — CBC WITH DIFFERENTIAL/PLATELET
Abs Immature Granulocytes: 0.47 10*3/uL — ABNORMAL HIGH (ref 0.00–0.07)
Basophils Absolute: 0 10*3/uL (ref 0.0–0.1)
Basophils Relative: 0 %
Eosinophils Absolute: 0 10*3/uL (ref 0.0–0.5)
Eosinophils Relative: 0 %
HCT: 36.3 % — ABNORMAL LOW (ref 39.0–52.0)
Hemoglobin: 12 g/dL — ABNORMAL LOW (ref 13.0–17.0)
IMMATURE GRANULOCYTES: 3 %
Lymphocytes Relative: 3 %
Lymphs Abs: 0.5 10*3/uL — ABNORMAL LOW (ref 0.7–4.0)
MCH: 31.6 pg (ref 26.0–34.0)
MCHC: 33.1 g/dL (ref 30.0–36.0)
MCV: 95.5 fL (ref 80.0–100.0)
Monocytes Absolute: 1.1 10*3/uL — ABNORMAL HIGH (ref 0.1–1.0)
Monocytes Relative: 7 %
NEUTROS PCT: 87 %
Neutro Abs: 15.1 10*3/uL — ABNORMAL HIGH (ref 1.7–7.7)
Platelets: 150 10*3/uL (ref 150–400)
RBC: 3.8 MIL/uL — ABNORMAL LOW (ref 4.22–5.81)
RDW: 13.8 % (ref 11.5–15.5)
WBC: 17.2 10*3/uL — ABNORMAL HIGH (ref 4.0–10.5)
nRBC: 0.1 % (ref 0.0–0.2)

## 2018-03-09 LAB — COMPREHENSIVE METABOLIC PANEL
ALT: 32 U/L (ref 0–44)
AST: 43 U/L — ABNORMAL HIGH (ref 15–41)
Albumin: 3.8 g/dL (ref 3.5–5.0)
Alkaline Phosphatase: 115 U/L (ref 38–126)
Anion gap: 11 (ref 5–15)
BUN: 35 mg/dL — ABNORMAL HIGH (ref 8–23)
CO2: 27 mmol/L (ref 22–32)
Calcium: 12.1 mg/dL — ABNORMAL HIGH (ref 8.9–10.3)
Chloride: 107 mmol/L (ref 98–111)
Creatinine, Ser: 0.96 mg/dL (ref 0.61–1.24)
GFR calc Af Amer: >60 mL/min (ref >60)
GFR calc non Af Amer: >60 mL/min (ref >60)
Glucose, Bld: 172 mg/dL — ABNORMAL HIGH (ref 70–99)
Potassium: 3.2 mmol/L — ABNORMAL LOW (ref 3.5–5.1)
Sodium: 145 mmol/L (ref 135–145)
Total Bilirubin: 0.7 mg/dL (ref 0.3–1.2)
Total Protein: 7.1 g/dL (ref 6.5–8.1)

## 2018-03-09 LAB — CREATININE, SERUM
Creatinine, Ser: 0.8 mg/dL (ref 0.61–1.24)
GFR calc Af Amer: >60 mL/min (ref >60)
GFR calc non Af Amer: >60 mL/min (ref >60)

## 2018-03-09 LAB — LACTIC ACID, PLASMA
LACTIC ACID, VENOUS: 2 mmol/L — AB (ref 0.5–1.9)
Lactic Acid, Venous: 3 mmol/L (ref 0.5–1.9)
Lactic Acid, Venous: 2.3 mmol/L (ref 0.5–1.9)

## 2018-03-09 LAB — TROPONIN I: Troponin I: <0.03 ng/mL (ref <0.03)

## 2018-03-09 MED ORDER — TAMSULOSIN HCL 0.4 MG PO CAPS: 0.4000 mg | ORAL_CAPSULE | Freq: Every day | ORAL | Status: DC

## 2018-03-09 MED ORDER — SODIUM CHLORIDE 0.9 % IV BOLUS
1000.0000 mL | Freq: Once | INTRAVENOUS | Status: AC
  Administered 2018-03-09: 1000 mL via INTRAVENOUS

## 2018-03-09 MED ORDER — APOAEQUORIN 10 MG PO CAPS: 10.0000 | ORAL_CAPSULE | Freq: Every day | ORAL | Status: DC

## 2018-03-09 MED ORDER — SODIUM CHLORIDE 0.9 % IV SOLN
INTRAVENOUS | Status: DC
  Administered 2018-03-09 – 2018-03-10 (×2): via INTRAVENOUS

## 2018-03-09 MED ORDER — TIMOLOL MALEATE 0.25 % OP SOLN
1.0000 [drp] | Freq: Every day | OPHTHALMIC | Status: DC
  Filled 2018-03-09: qty 5

## 2018-03-09 MED ORDER — CHLORHEXIDINE GLUCONATE 0.12 % MT SOLN
15.0000 mL | Freq: Two times a day (BID) | OROMUCOSAL | Status: DC
  Administered 2018-03-09 – 2018-03-10 (×2): 15 mL via OROMUCOSAL
  Filled 2018-03-09: qty 15

## 2018-03-09 MED ORDER — ACETAMINOPHEN 650 MG RE SUPP
650.0000 mg | Freq: Once | RECTAL | Status: AC
  Administered 2018-03-09: 650 mg via RECTAL
  Filled 2018-03-09: qty 1

## 2018-03-09 MED ORDER — SCOPOLAMINE 1 MG/3DAYS TD PT72
1.0000 | MEDICATED_PATCH | TRANSDERMAL | Status: DC
  Administered 2018-03-11: 17:00:00 1.5 mg via TRANSDERMAL
  Filled 2018-03-09: qty 1

## 2018-03-09 MED ORDER — VITAMIN D3 25 MCG (1000 UNIT) PO TABS
1000.0000 [IU] | ORAL_TABLET | Freq: Every day | ORAL | Status: DC
  Filled 2018-03-09 (×2): qty 1

## 2018-03-09 MED ORDER — HYDRALAZINE HCL 20 MG/ML IJ SOLN
10.0000 mg | INTRAMUSCULAR | Status: DC | PRN
  Administered 2018-03-09: 10 mg via INTRAVENOUS
  Filled 2018-03-09: qty 1

## 2018-03-09 MED ORDER — ALLOPURINOL 300 MG PO TABS: 300.0000 mg | ORAL_TABLET | Freq: Every day | ORAL | Status: DC

## 2018-03-09 MED ORDER — SODIUM CHLORIDE 0.9 % IV SOLN
1.0000 g | Freq: Three times a day (TID) | INTRAVENOUS | Status: DC
  Administered 2018-03-09 – 2018-03-10 (×2): 1 g via INTRAVENOUS
  Filled 2018-03-09 (×6): qty 1

## 2018-03-09 MED ORDER — SODIUM CHLORIDE 0.9 % IV SOLN
2.0000 g | Freq: Once | INTRAVENOUS | Status: AC
  Administered 2018-03-09: 2 g via INTRAVENOUS
  Filled 2018-03-09: qty 2

## 2018-03-09 MED ORDER — POTASSIUM CHLORIDE CRYS ER 20 MEQ PO TBCR: 40.0000 meq | EXTENDED_RELEASE_TABLET | Freq: Once | ORAL | Status: DC

## 2018-03-09 MED ORDER — ENOXAPARIN SODIUM 40 MG/0.4ML ~~LOC~~ SOLN
40.0000 mg | SUBCUTANEOUS | Status: DC
  Administered 2018-03-10: 01:00:00 40 mg via SUBCUTANEOUS
  Filled 2018-03-09: qty 0.4

## 2018-03-09 MED ORDER — IPRATROPIUM-ALBUTEROL 0.5-2.5 (3) MG/3ML IN SOLN: 3.0000 mL | RESPIRATORY_TRACT | Status: DC | PRN

## 2018-03-09 MED ORDER — ACETAMINOPHEN 650 MG RE SUPP
650.0000 mg | Freq: Four times a day (QID) | RECTAL | Status: DC | PRN
  Administered 2018-03-10 – 2018-03-12 (×2): 650 mg via RECTAL
  Filled 2018-03-09 (×2): qty 1

## 2018-03-09 MED ORDER — VANCOMYCIN HCL 10 G IV SOLR
1250.0000 mg | INTRAVENOUS | Status: DC
  Filled 2018-03-09: qty 1250

## 2018-03-09 MED ORDER — VANCOMYCIN HCL IN DEXTROSE 1-5 GM/200ML-% IV SOLN
1000.0000 mg | Freq: Once | INTRAVENOUS | Status: AC
  Administered 2018-03-09: 1000 mg via INTRAVENOUS
  Filled 2018-03-09: qty 200

## 2018-03-09 MED ORDER — SODIUM CHLORIDE 0.9 % IV SOLN
2.0000 g | Freq: Two times a day (BID) | INTRAVENOUS | Status: DC
  Administered 2018-03-10: 02:00:00 2 g via INTRAVENOUS
  Filled 2018-03-09 (×3): qty 2

## 2018-03-09 MED ORDER — LORAZEPAM 0.5 MG PO TABS: 0.5000 mg | ORAL_TABLET | Freq: Two times a day (BID) | ORAL | Status: DC | PRN

## 2018-03-09 MED ORDER — ORAL CARE MOUTH RINSE
15.0000 mL | Freq: Two times a day (BID) | OROMUCOSAL | Status: DC
  Administered 2018-03-09 – 2018-03-11 (×5): 15 mL via OROMUCOSAL

## 2018-03-09 NOTE — ED Triage Notes (Signed)
Pt comes from [facility] via EMS after pt is becoming increasingly weak with cough and "gurgling". Pt normally on 2L and pt was 90% on those 2L. Pt arrives with nonrebreather on 10L and pt at 99%. Pt dx with pneumonia yesterday but has not started antiobiotics. Pt has hx of parkinsons. Pt was 100.6 ax with EMS. Pt has had some trouble urinating and abdomen is distended.

## 2018-03-09 NOTE — Progress Notes (Signed)
Pharmacy Antibiotic Note  Darrell Fry is a 85 y.o. male admitted on 03/09/2018 with pneumonia.  Pharmacy has been consulted for Vancomycin and Cefepime dosing.  Plan: Cefepime 2g IV every 12 hours.  Vancomycin 1g given in ED, followed by 1250mg  IV every 24 hours. Goal AUC 400-550. Expected AUC: 499.5 SCr used: 0.96   Height: 5\' 9"  (175.3 cm) Weight: 157 lb 13.6 oz (71.6 kg) IBW/kg (Calculated) : 70.7  Temp (24hrs), Avg:99.7 F (37.6 C), Min:97.7 F (36.5 C), Max:100.8 F (38.2 C)  Recent Labs  Lab 03/04/18 1141 03/05/18 0058 03/09/18 0844 03/09/18 1047  WBC 11.2* 9.6 17.2*  --   CREATININE 0.82 0.73 0.96  --   LATICACIDVEN  --   --  3.0* 2.3*    Estimated Creatinine Clearance: 53.2 mL/min (by C-G formula based on SCr of 0.96 mg/dL).    No Known Allergies  Antimicrobials this admission: Cefepime 3/7 >>  Vancomycin 3/7 >>   Dose adjustments this admission:   Microbiology results: 3/7 BCx: pending 3/7 UCx: pending  3/7 Sputum: pending  3/7 MRSA PCR: pending  Thank you for allowing pharmacy to be a part of this patient's care.  Stormy Card, [facility] 03/09/2018 1:12 PM

## 2018-03-09 NOTE — ED Provider Notes (Signed)
[facility] Emergency Department Provider Note ____________________________________________   First MD Initiated Contact with Patient 03/09/18 [phone]     (approximate)  I have reviewed the triage vital signs and the nursing notes.   HISTORY  Chief Complaint Weakness  Level 5 caveat: History of present illness limited due to altered mental status  HPI Cheo Banaag is a 85 y.o. male with PMH as noted below who presents from [facility] with worsening shortness of breath, weakness, and cough.  Per his son, the patient was discharged from the hospital several days ago.  Yesterday he began to cough and his breath sounded like gurgling, but it improved when he sat up.  He had a chest x-ray which showed atelectasis versus infiltrate.  Today his condition worsened.  In addition his lower abdomen is distended.  Per EMS, the O2 saturation was 90% on 2 L by nasal cannula.  The patient is unable to give any history.   Past Medical History:  Diagnosis Date  . Arthritis    knees  . Cerebral amyloid angiopathy (HCC)   . Parkinson's disease (HCC)    no meds  . Parkinson's disease dementia (HCC)   . REM sleep behavior disorder   . Wears hearing aid in both ears     Patient Active Problem List   Diagnosis Date Noted  . AMS (altered mental status) 03/05/2018    Past Surgical History:  Procedure Laterality Date  . CATARACT EXTRACTION W/PHACO Left 07/18/2017   Procedure: CATARACT EXTRACTION PHACO AND INTRAOCULAR LENS PLACEMENT (IOC) IVA TOPICAL  LEFT;  Surgeon: Lockie Mola, MD;  Location: [facility];  Service: Ophthalmology;  Laterality: Left;  . CATARACT EXTRACTION W/PHACO Right 08/15/2017   Procedure: CATARACT EXTRACTION PHACO AND INTRAOCULAR LENS PLACEMENT (IOC) RIGHT;  Surgeon: Lockie Mola, MD;  Location: [facility];  Service: Ophthalmology;  Laterality: Right;  . COLONOSCOPY    . FINGER SURGERY     no anesthesia    Prior to  Admission medications   Medication Sig Start Date End Date Taking? Authorizing Provider  acetaminophen (TYLENOL) 650 MG suppository Place 650 mg rectally every 4 (four) hours as needed.   Yes [provider]  allopurinol (ZYLOPRIM) 300 MG tablet Take 300 mg by mouth daily. 11/26/17  Yes [provider]  Apoaequorin (PREVAGEN) 10 MG CAPS Take 10 capsules by mouth daily.    Yes [provider]  cholecalciferol (VITAMIN D) 25 MCG (1000 UT) tablet Take 1 tablet by mouth daily.    Yes [provider]  ipratropium-albuterol (DUONEB) 0.5-2.5 (3) MG/3ML SOLN Take 3 mLs by nebulization every 4 (four) hours as needed.   Yes [provider]  LORazepam (ATIVAN) 0.5 MG tablet Take 0.5 mg by mouth every 12 (twelve) hours as needed for anxiety. 03/08/18  Yes [provider]  Multiple Vitamins-Minerals (PRESERVISION AREDS 2 PO) Take 1 tablet by mouth 2 (two) times daily.    Yes [provider]  scopolamine (TRANSDERM-SCOP) 1 MG/3DAYS Place 1 patch onto the skin every 3 (three) days. 03/08/18  Yes [provider]  tamsulosin (FLOMAX) 0.4 MG CAPS capsule Take 1 capsule (0.4 mg total) by mouth daily. 03/06/18  Yes Mody, Sital, MD  timolol (BETIMOL) 0.25 % ophthalmic solution Place 1-2 drops into both eyes daily.    Yes [provider]    Allergies Patient has no known allergies.  History reviewed. No pertinent family history.  Social History Social History   Tobacco Use  .  Smoking status: Former Games developermoker  . Smokeless tobacco: Never Used  . Tobacco comment: quit in 1970s  Substance Use Topics  . Alcohol use: Yes    Alcohol/week: 5.0 standard drinks    Types: 5 Cans of beer per week  . Drug use: Not on file    Review of Systems Level 5 caveat: Unable to obtain review of systems due to altered mental status   ____________________________________________   PHYSICAL EXAM:  VITAL SIGNS: ED Triage Vitals  Enc Vitals Group      BP 03/09/18 0841 (!) 146/77     Pulse Rate 03/09/18 0841 (!) 103     Resp 03/09/18 0841 (!) 28     Temp 03/09/18 0841 98.9 F (37.2 C)     Temp Source 03/09/18 0841 Axillary     SpO2 03/09/18 0838 100 %     Weight 03/09/18 0839 154 lb 5.2 oz (70 kg)     Height 03/09/18 0839 5\' 5"  (1.651 m)     Head Circumference --      Peak Flow --      Pain Score --      Pain Loc --      Pain Edu? --      Excl. in GC? --     Constitutional: Alert, minimally responsive.  Uncomfortable appearing. Eyes: Conjunctivae are normal.  EOMI.  PERRLA. Head: Atraumatic. Nose: No congestion/rhinnorhea. Mouth/Throat: Mucous membranes are very dry.   Neck: No swelling. Cardiovascular: Tachycardic, regular rhythm. Grossly normal heart sounds.  Good peripheral circulation. Respiratory: Increased respiratory effort.  Decreased breath sounds bilaterally with some scattered rhonchi.   Gastrointestinal: Soft and nontender.  Suprapubic distention.  Genitourinary: No flank tenderness. Musculoskeletal: Extremities warm and well perfused.  Neurologic: Motor intact in all extremities. Skin:  Skin is warm and dry. No rash noted. Psychiatric: Unable to assess.  ____________________________________________   LABS (all labs ordered are listed, but only abnormal results are displayed)  Labs Reviewed  LACTIC ACID, PLASMA - Abnormal; Notable for the following components:      Result Value   Lactic Acid, Venous 3.0 (*)    All other components within normal limits  COMPREHENSIVE METABOLIC PANEL - Abnormal; Notable for the following components:   Potassium 3.2 (*)    Glucose, Bld 172 (*)    BUN 35 (*)    Calcium 12.1 (*)    AST 43 (*)    All other components within normal limits  CBC WITH DIFFERENTIAL/PLATELET - Abnormal; Notable for the following components:   WBC 17.2 (*)    RBC 3.80 (*)    Hemoglobin 12.0 (*)    HCT 36.3 (*)    Neutro Abs 15.1 (*)    Lymphs Abs 0.5 (*)    Monocytes Absolute 1.1 (*)    Abs  Immature Granulocytes 0.47 (*)    All other components within normal limits  URINALYSIS, COMPLETE (UACMP) WITH MICROSCOPIC - Abnormal; Notable for the following components:   Color, Urine YELLOW (*)    APPearance CLEAR (*)    Hgb urine dipstick MODERATE (*)    Protein, ur 30 (*)    All other components within normal limits  CULTURE, BLOOD (ROUTINE X 2)  CULTURE, BLOOD (ROUTINE X 2)  URINE CULTURE  TROPONIN I  INFLUENZA PANEL BY PCR (TYPE A & B)  LACTIC ACID, PLASMA   ____________________________________________  EKG  ED ECG REPORT I, Dionne BucySebastian Dynver Clemson, the attending physician, personally viewed and interpreted this ECG.  Date: 03/09/2018 EKG Time:  1610 Rate: 105 Rhythm: normal sinus rhythm QRS Axis: normal Intervals: RBBB ST/T Wave abnormalities: Nonspecific ST abnormalities inferior and lateral Narrative Interpretation: Nonspecific ST abnormalities with no significant change in morphology when compared to EKG of 03/05/2018 (incorrectly interpreted by machine as acute MI)  ____________________________________________  RADIOLOGY  CXR: Left lower lobe opacity  ____________________________________________   PROCEDURES  Procedure(s) performed: No  Procedures  Critical Care performed: Yes  CRITICAL CARE Performed by: Dionne Bucy   Total critical care time: 30 minutes  Critical care time was exclusive of separately billable procedures and treating other patients.  Critical care was necessary to treat or prevent imminent or life-threatening deterioration.  Critical care was time spent personally by me on the following activities: development of treatment plan with patient and/or surrogate as well as nursing, discussions with consultants, evaluation of patient's response to treatment, examination of patient, obtaining history from patient or surrogate, ordering and performing treatments and interventions, ordering and review of laboratory studies, ordering and  review of radiographic studies, pulse oximetry and re-evaluation of patient's condition. ____________________________________________   INITIAL IMPRESSION / ASSESSMENT AND PLAN / ED COURSE  Pertinent labs & imaging results that were available during my care of the patient were reviewed by me and considered in my medical decision making (see chart for details).  85 year old male with PMH as noted above including Parkinson's disease presents with worsening cough, respiratory distress and fever since yesterday.  Chest x-ray at the facility yesterday showed left lower lobe atelectasis versus infiltrate.  I reviewed the past medical records in Epic.  The patient was just admitted to the hospital earlier this week with altered mental status, thought to be acute encephalopathy in the setting of progressive neurodegenerative disease.  Per the son and prior records, the patient is full code at this time.  On exam currently, the patient is awake but minimally responsive and very weak appearing.  He is slightly tachycardic and tachypneic, and was found to be febrile by EMS.  O2 saturation is 100% on nonrebreather.  He has very dry mucous membranes and coarse breath sounds bilaterally.  The suprapubic area is distended.  The remainder of the exam is as described above.  Overall presentation is concerning for sepsis.  Most likely etiology would be respiratory, versus possible UTI with retention.  I have ordered lab work-up and fluids per sepsis protocol.  In addition given the finding of possible pneumonia on x-ray yesterday and the clinical presentation I will start empiric antibiotics for HCAP.  The lower abdominal distention is most likely due to urinary retention.  If it does not resolve after Foley catheter is placed, will consider imaging.  ----------------------------------------- 10:25 AM on 03/09/2018 -----------------------------------------  Chest x-ray confirms left lower lobe opacity.  The  patient is negative for influenza.  The suprapubic distention was totally resolved after Foley catheter placement, and over 1 L of urine was drained.  The UA is not consistent with a UTI.  Lactate is elevated, and 2 L of normal saline have been given.  The patient appears much more comfortable.  He will require admission for continued treatment for sepsis.  I signed the patient out to the hospitalist Dr. Tobi Bastos at approximately 10:25 AM. ____________________________________________   FINAL CLINICAL IMPRESSION(S) / ED DIAGNOSES  Final diagnoses:  Sepsis, due to unspecified organism, unspecified whether acute organ dysfunction present [facility])      NEW MEDICATIONS STARTED DURING THIS VISIT:  New Prescriptions   No medications on file  Note:  This document was prepared using Dragon voice recognition software and may include unintentional dictation errors.    Dionne Bucy, MD 03/09/18 1026

## 2018-03-09 NOTE — Progress Notes (Signed)
CODE SEPSIS - PHARMACY COMMUNICATION  **Broad Spectrum Antibiotics should be administered within 1 hour of Sepsis diagnosis**  Time Code Sepsis Called/Page Received: 08:43  Antibiotics Ordered: Cefepime/Vancomycin  Time of 1st antibiotic administration: 08:55  Additional action taken by pharmacy:    If necessary, Name of Provider/Nurse Contacted:      Stormy Card [facility] Clinical Pharmacist  03/09/2018  9:02 AM

## 2018-03-09 NOTE — ED Notes (Signed)
Bed not ready. Spoke with Madelin Rear, RN. To call back to receive report.

## 2018-03-09 NOTE — Progress Notes (Addendum)
Advanced care plan. Purpose of the Encounter: CODE STATUS Parties in Attendance: Patient and family Patient's Decision Capacity: Not good Subjective/Patient's story: Was referred from [facility] facility for confusion, shortness of breath cough  Patient has been referred for confusion and weakness.  According to family members she has been not doing well for the last 1 week.  He had some urinary retention.  Foley catheter was placed in the emergency room and patient voided better and abdominal distention improved. Patient also had some cough and shortness of breath as per family.  In the emergency room patient is confused and lethargic.  Was also hypoxic and put on oxygen via nonrebreather mask at 10 L.  Was worked up with chest x-ray which showed pneumonia and patient was started on broad-spectrum IV antibiotics.  Lactic acid was elevated.  Not much history could be obtained from the patient per se. Objective/Medical story Patient has pneumonia and sepsis Has elevated lactic acid level Hypoxic and currently on oxygen high flow at 10 L Needs IV antibiotics, fluids and evaluation for urinary retention in this hospitalization Goals of care determination:  Advance care directives goals of care treatment plan discussed with the patient's family in detail . For now they want everything done which includes CPR, intubation ventilator if the need arises. Patients wife will discuss with other family members and will let us know if there is change in decision. CODE STATUS: Full code Time spent discussing advanced care planning: 16 minutes

## 2018-03-09 NOTE — Progress Notes (Signed)
PHARMACIST - PHYSICIAN ORDER COMMUNICATION  CONCERNING: P&T Medication Policy on Herbal Medications  DESCRIPTION:  This patient's order for:  Prevagen has been noted.  This product(s) is classified as an "herbal" or natural product. Due to a lack of definitive safety studies or FDA approval, nonstandard manufacturing practices, plus the potential risk of unknown drug-drug interactions while on inpatient medications, the Pharmacy and Therapeutics Committee does not permit the use of "herbal" or natural products of this type within [place].   ACTION TAKEN: The pharmacy department is unable to verify this order at this time and your patient has been informed of this safety policy. Please reevaluate patient's clinical condition at discharge and address if the herbal or natural product(s) should be resumed at that time.   

## 2018-03-09 NOTE — Progress Notes (Signed)
Transfer from ICU. Wife and son at bedside and asking questions about prognosis. Pt has upper airway congestion with small amount removed by yaunker suction - no gag reflex. ST consult submitted. Family advised on no po's until swallow evaluation. Bites on oral swab-no other responses except cough which is nonproductive at this time. . Foley active. No movement noted.

## 2018-03-09 NOTE — ED Notes (Signed)
ED TO INPATIENT HANDOFF REPORT  ED Nurse Name and Phone #: Shanda Bumps [phone]  S Name/Age/Gender Darrell Fry 85 y.o. male Room/Bed: ED26A/ED26A  Code Status   Code Status: Prior  Home/SNF/Other Nursing Home Patient oriented to: UTA Is this baseline? NO  Triage Complete: Triage complete  Chief Complaint Code Sepsis  [facility] Note [facility] after pt is becoming increasingly weak with cough and "gurgling". Pt normally on 2L and pt was 90% on those 2L. Pt arrives with nonrebreather on 10L and pt at 99%. Pt dx with pneumonia yesterday but has not started antiobiotics. Pt has hx of parkinsons. Pt was 100.6 ax with EMS. Pt has had some trouble urinating and abdomen is distended.    Allergies No Known Allergies  Level of Care/Admitting Diagnosis ED Disposition    ED Disposition Condition Comment   Admit  Hospital Area: [facility] [[id number]]  Level of Care: Med-Surg [16]  Diagnosis: HCAP (healthcare-associated pneumonia) [863817]  Admitting Physician: Ihor Austin [[id number]]  Attending Physician: Ihor Austin [[id number]]  Estimated length of stay: past midnight tomorrow  Certification:: I certify this patient will need inpatient services for at least 2 midnights  PT Class (Do Not Modify): Inpatient [101]  PT Acc Code (Do Not Modify): Private [1]       B Medical/Surgery History Past Medical History:  Diagnosis Date  . Arthritis    knees  . Cerebral amyloid angiopathy (HCC)   . Parkinson's disease (HCC)    no meds  . Parkinson's disease dementia (HCC)   . REM sleep behavior disorder   . Wears hearing aid in both ears    Past Surgical History:  Procedure Laterality Date  . CATARACT EXTRACTION W/PHACO Left 07/18/2017   Procedure: CATARACT EXTRACTION PHACO AND INTRAOCULAR LENS PLACEMENT (IOC) IVA TOPICAL  LEFT;  Surgeon: Lockie Mola, MD;  Location: [facility];  Service: Ophthalmology;  Laterality: Left;  .  CATARACT EXTRACTION W/PHACO Right 08/15/2017   Procedure: CATARACT EXTRACTION PHACO AND INTRAOCULAR LENS PLACEMENT (IOC) RIGHT;  Surgeon: Lockie Mola, MD;  Location: [facility];  Service: Ophthalmology;  Laterality: Right;  . COLONOSCOPY    . FINGER SURGERY     no anesthesia     A IV Location/Drains/Wounds Patient Lines/Drains/Airways Status   Active Line/Drains/Airways    Name:   Placement date:   Placement time:   Site:   Days:   Peripheral IV 03/09/18 Left Antecubital   03/09/18    0842    Antecubital   less than 1   Peripheral IV 03/09/18 Left Hand   03/09/18    0852    Hand   less than 1   Urethral Catheter Brandy, RN Temperature probe 16 Fr.   03/09/18    0909    Temperature probe   less than 1          Intake/Output Last 24 hours  Intake/Output Summary (Last 24 hours) at 03/09/2018 1037 Last data filed at 03/09/2018 1017 Gross per 24 hour  Intake 1495.94 ml  Output 1300 ml  Net 195.94 ml    Labs/Imaging Results for orders placed or performed during the hospital encounter of 03/09/18 (from the past 48 hour(s))  Lactic acid, plasma     Status: Abnormal   Collection Time: 03/09/18  8:44 AM  Result Value Ref Range   Lactic Acid, Venous 3.0 (HH) 0.5 - 1.9 mmol/L    Comment: CRITICAL RESULT CALLED TO, READ BACK BY AND VERIFIED WITH  JESSICA Rashawd Laskaris AT 0930 03/09/2018.PMF Performed at [facility], [address] [place], [name] [address]   Comprehensive metabolic panel     Status: Abnormal   Collection Time: 03/09/18  8:44 AM  Result Value Ref Range   Sodium 145 135 - 145 mmol/L   Potassium 3.2 (L) 3.5 - 5.1 mmol/L   Chloride 107 98 - 111 mmol/L   CO2 27 22 - 32 mmol/L   Glucose, Bld 172 (H) 70 - 99 mg/dL   BUN 35 (H) 8 - 23 mg/dL   Creatinine, Ser 6.040.96 0.61 - 1.24 mg/dL   Calcium 54.012.1 (H) 8.9 - 10.3 mg/dL   Total Protein 7.1 6.5 - 8.1 g/dL   Albumin 3.8 3.5 - 5.0 g/dL   AST 43 (H) 15 - 41 U/L   ALT 32 0 - 44 U/L   Alkaline Phosphatase 115  38 - 126 U/L   Total Bilirubin 0.7 0.3 - 1.2 mg/dL   GFR calc non Af Amer >60 >60 mL/min   GFR calc Af Amer >60 >60 mL/min   Anion gap 11 5 - 15    Comment: Performed at [facility], [address] [place], [name] [address]  CBC WITH DIFFERENTIAL     Status: Abnormal   Collection Time: 03/09/18  8:44 AM  Result Value Ref Range   WBC 17.2 (H) 4.0 - 10.5 K/uL   RBC 3.80 (L) 4.22 - 5.81 MIL/uL   Hemoglobin 12.0 (L) 13.0 - 17.0 g/dL   HCT 14.736.3 (L) 82.939.0 - 56.252.0 %   MCV 95.5 80.0 - 100.0 fL   MCH 31.6 26.0 - 34.0 pg   MCHC 33.1 30.0 - 36.0 g/dL   RDW 13.013.8 86.511.5 - 78.415.5 %   Platelets 150 150 - 400 K/uL   nRBC 0.1 0.0 - 0.2 %   Neutrophils Relative % 87 %   Neutro Abs 15.1 (H) 1.7 - 7.7 K/uL   Lymphocytes Relative 3 %   Lymphs Abs 0.5 (L) 0.7 - 4.0 K/uL   Monocytes Relative 7 %   Monocytes Absolute 1.1 (H) 0.1 - 1.0 K/uL   Eosinophils Relative 0 %   Eosinophils Absolute 0.0 0.0 - 0.5 K/uL   Basophils Relative 0 %   Basophils Absolute 0.0 0.0 - 0.1 K/uL   Immature Granulocytes 3 %   Abs Immature Granulocytes 0.47 (H) 0.00 - 0.07 K/uL    Comment: Performed at [facility], [address] [place], [name] [address]  Urinalysis, Complete w Microscopic     Status: Abnormal   Collection Time: 03/09/18  8:44 AM  Result Value Ref Range   Color, Urine YELLOW (A) YELLOW   APPearance CLEAR (A) CLEAR   Specific Gravity, Urine 1.019 1.005 - 1.030   pH 5.0 5.0 - 8.0   Glucose, UA NEGATIVE NEGATIVE mg/dL   Hgb urine dipstick MODERATE (A) NEGATIVE   Bilirubin Urine NEGATIVE NEGATIVE   Ketones, ur NEGATIVE NEGATIVE mg/dL   Protein, ur 30 (A) NEGATIVE mg/dL   Nitrite NEGATIVE NEGATIVE   Leukocytes,Ua NEGATIVE NEGATIVE   RBC / HPF 21-50 0 - 5 RBC/hpf   WBC, UA 6-10 0 - 5 WBC/hpf   Bacteria, UA NONE SEEN NONE SEEN   Squamous Epithelial / LPF NONE SEEN 0 - 5   Mucus PRESENT     Comment: Performed at [facility], [address] [place], [name] [address]   Troponin I - Once     Status: None   Collection Time:  03/09/18  8:44 AM  Result Value Ref Range   Troponin I <0.03 <0.03 ng/mL    Comment: Performed at [facility], [address] [place], [place] [address]  Influenza panel by PCR (type A & B)     Status: None   Collection Time: 03/09/18  8:44 AM  Result Value Ref Range   Influenza A By PCR NEGATIVE NEGATIVE   Influenza B By PCR NEGATIVE NEGATIVE    Comment: (NOTE) The Xpert Xpress Flu assay is intended as an aid in the diagnosis of  influenza and should not be used as a sole basis for treatment.  This  assay is FDA approved for nasopharyngeal swab specimens only. Nasal  washings and aspirates are unacceptable for Xpert Xpress Flu testing. Performed at [facility], [address] [place], [place] [address]    Dg Chest Port 1 View  Result Date: 03/09/2018 CLINICAL DATA:  Cough and fevers EXAM: PORTABLE CHEST 1 VIEW COMPARISON:  None. FINDINGS: Cardiac shadow is within normal limits. Mild left basilar atelectasis is seen. No for other focal infiltrate is noted. Degenerative changes of thoracic spine are seen. IMPRESSION: Mild left basilar atelectasis. Electronically Signed   By: Alcide Clever M.D.   On: 03/09/2018 09:03    Pending Labs Unresulted Labs (From admission, onward)    Start     Ordered   03/09/18 1200  Lactic acid, plasma  Once,   STAT     03/09/18 1033   03/09/18 0843  Lactic acid, plasma  Now then every 2 hours,   STAT     03/09/18 0843   03/09/18 0843  Blood Culture (routine x 2)  BLOOD CULTURE X 2,   STAT     03/09/18 0843   03/09/18 0843  Urine culture  ONCE - STAT,   STAT     03/09/18 0843   Signed and Held  Culture, blood (routine x 2) Call MD if unable to obtain prior to antibiotics being given  BLOOD CULTURE X 2,   R    Comments:  If blood cultures drawn in Emergency Department - Do not draw and cancel order    Signed and Held   Signed and Held  Culture, sputum-assessment  Once,   R      Signed and Held   Signed and Held  Gram stain  Once,   R     Signed and Held   Signed and Held  Strep pneumoniae urinary antigen  Once,   R     Signed and Held   Signed and Held  CBC  (enoxaparin (LOVENOX)    CrCl >/= 30 ml/min)  Once,   R    Comments:  Baseline for enoxaparin therapy IF NOT ALREADY DRAWN.  Notify MD if PLT < 100 K.    Signed and Held   Signed and Held  Creatinine, serum  (enoxaparin (LOVENOX)    CrCl >/= 30 ml/min)  Once,   R    Comments:  Baseline for enoxaparin therapy IF NOT ALREADY DRAWN.    Signed and Held   Signed and Held  Creatinine, serum  (enoxaparin (LOVENOX)    CrCl >/= 30 ml/min)  Weekly,   R    Comments:  while on enoxaparin therapy    Signed and Held   Signed and Held  Basic metabolic panel  Tomorrow morning,   R     Signed and Held   Signed and Held  CBC  Tomorrow morning,  R     Signed and Held   Signed and Held  Legionella Pneumophila Serogp 1 Ur Ag  Once,   R     Signed and Held          Vitals/Pain Today's Vitals   03/09/18 0930 03/09/18 0946 03/09/18 1000 03/09/18 1030  BP: (!) 147/62  (!) 170/62 (!) 149/71  Pulse: 88  86 82  Resp: (!) 24  (!) 24 (!) 23  Temp: (!) 100.4 F (38 C)  100 F (37.8 C) 99.9 F (37.7 C)  TempSrc:      SpO2: 99%  100% 99%  Weight:      Height:      PainSc:  Asleep      Isolation Precautions No active isolations  Medications Medications  potassium chloride SA (K-DUR,KLOR-CON) CR tablet 40 mEq (has no administration in time range)  sodium chloride 0.9 % bolus 1,000 mL (1,000 mLs Intravenous Bolus 03/09/18 0851)  acetaminophen (TYLENOL) suppository 650 mg (650 mg Rectal Given 03/09/18 0902)  vancomycin (VANCOCIN) IVPB 1000 mg/200 mL premix (0 mg Intravenous Stopped 03/09/18 1017)  ceFEPIme (MAXIPIME) 2 g in sodium chloride 0.9 % 100 mL IVPB ( Intravenous Stopped 03/09/18 0926)  sodium chloride 0.9 % bolus 1,000 mL (1,000 mLs Intravenous Bolus 03/09/18 0949)    Mobility walks with device Moderate fall  risk   Focused Assessments Neuro Assessment Handoff:  Swallow screen pass? UTA Cardiac Rhythm: Sinus tachycardia, Normal sinus rhythm       Neuro Assessment:   Neuro Checks:      Last Documented NIHSS Modified Score:   Has TPA been given? No If patient is a Neuro Trauma and patient is going to OR before floor call report to 4N Charge nurse: [phone] or [phone]     R Recommendations: See Admitting Provider Note  Report given to:   Additional Notes: pt on nonrebreather at 10L on 100%. Pt GCS around a 9, UTA things like orientation or ability to swallow pills; pt has parkinsons; family very helpful and inquisitive

## 2018-03-09 NOTE — H&P (Signed)
[facility] [place] [facility]   PATIENT NAME: Darrell Fry    MR#:  [id number]  DATE OF BIRTH:  07-01-1927  DATE OF ADMISSION:  03/09/2018  PRIMARY CARE PHYSICIAN: Marisue Ivan, MD   REQUESTING/REFERRING PHYSICIAN:   CHIEF COMPLAINT:   Chief Complaint  Patient presents with  . Weakness    HISTORY OF PRESENT ILLNESS: Darrell Fry  is a 85 y.o. male with a known history of Parkinson's disease, arthritis, cerebral amyloid angiopathy is a resident of [address] independent living facility.  Patient has been referred for confusion and weakness.  According to family members she has been not doing well for the last 1 week.  He had some urinary retention.  Foley catheter was placed in the emergency room and patient voided better and abdominal distention improved. Patient also had some cough and shortness of breath as per family.  In the emergency room patient is confused and lethargic.  Was also hypoxic and put on oxygen via nonrebreather mask at 10 L.  Was worked up with chest x-ray which showed pneumonia and patient was started on broad-spectrum IV antibiotics.  Lactic acid was elevated.  Not much history could be obtained from the patient per se.  PAST MEDICAL HISTORY:   Past Medical History:  Diagnosis Date  . Arthritis    knees  . Cerebral amyloid angiopathy (HCC)   . Parkinson's disease (HCC)    no meds  . Parkinson's disease dementia (HCC)   . REM sleep behavior disorder   . Wears hearing aid in both ears     PAST SURGICAL HISTORY:  Past Surgical History:  Procedure Laterality Date  . CATARACT EXTRACTION W/PHACO Left 07/18/2017   Procedure: CATARACT EXTRACTION PHACO AND INTRAOCULAR LENS PLACEMENT (IOC) IVA TOPICAL  LEFT;  Surgeon: Lockie Mola, MD;  Location[facility];  Service: Ophthalmology;  Laterality: Left;  . CATARACT EXTRACTION W/PHACO Right 08/15/2017   Procedure: CATARACT EXTRACTION PHACO AND INTRAOCULAR LENS  PLACEMENT (IOC) RIGHT;  Surgeon: Lockie Mola, MD;  Location: [facility];  Service: Ophthalmology;  Laterality: Right;  . COLONOSCOPY    . FINGER SURGERY     no anesthesia    SOCIAL HISTORY:  Social History   Tobacco Use  . Smoking status: Former Games developer  . Smokeless tobacco: Never Used  . Tobacco comment: quit in 1970s  Substance Use Topics  . Alcohol use: Yes    Alcohol/week: 5.0 standard drinks    Types: 5 Cans of beer per week    FAMILY HISTORY: Mother and father deceased No history of COPD diabetes cancer  DRUG ALLERGIES: No Known Allergies  REVIEW OF SYSTEMS:   Could not be obtained as patient is confused and lethargic MEDICATIONS AT HOME:  Prior to Admission medications   Medication Sig Start Date End Date Taking? Authorizing Provider  acetaminophen (TYLENOL) 650 MG suppository Place 650 mg rectally every 4 (four) hours as needed.   Yes [provider]  allopurinol (ZYLOPRIM) 300 MG tablet Take 300 mg by mouth daily. 11/26/17  Yes [provider]  Apoaequorin (PREVAGEN) 10 MG CAPS Take 10 capsules by mouth daily.    Yes [provider]  cholecalciferol (VITAMIN D) 25 MCG (1000 UT) tablet Take 1 tablet by mouth daily.    Yes [provider]  ipratropium-albuterol (DUONEB) 0.5-2.5 (3) MG/3ML SOLN Take 3 mLs by nebulization every 4 (four) hours as needed.   Yes [provider]  LORazepam (ATIVAN) 0.5 MG tablet Take  0.5 mg by mouth every 12 (twelve) hours as needed for anxiety. 03/08/18  Yes [provider]  Multiple Vitamins-Minerals (PRESERVISION AREDS 2 PO) Take 1 tablet by mouth 2 (two) times daily.    Yes [provider]  scopolamine (TRANSDERM-SCOP) 1 MG/3DAYS Place 1 patch onto the skin every 3 (three) days. 03/08/18  Yes [provider]  tamsulosin (FLOMAX) 0.4 MG CAPS capsule Take 1 capsule (0.4 mg total) by mouth daily. 03/06/18  Yes Mody, Sital, MD  timolol (BETIMOL) 0.25 %  ophthalmic solution Place 1-2 drops into both eyes daily.    Yes [provider]      PHYSICAL EXAMINATION:   VITAL SIGNS: Blood pressure (!) 149/71, pulse 82, temperature 99.9 F (37.7 C), resp. rate (!) 23, height 5\' 5"  (1.651 m), weight 70 kg, SpO2 99 %.  GENERAL:  85 y.o.-year-old patient lying in the bed on oxygen at 10 L EYES: Pupils equal, round, reactive to light and accommodation. No scleral icterus. Extraocular muscles intact.  HEENT: Head atraumatic, normocephalic. Oropharynx and nasopharynx clear.  NECK:  Supple, no jugular venous distention. No thyroid enlargement, no tenderness.  LUNGS: Decreased breath sounds bilaterally, Rales heard in the left lung.  On oxygen supplementation CARDIOVASCULAR: S1, S2 normal. No murmurs, rubs, or gallops.  ABDOMEN: Soft, nontender, nondistended. Bowel sounds present. No organomegaly or mass.  EXTREMITIES: No pedal edema, cyanosis, or clubbing.  NEUROLOGIC: Patient is lethargic and confused Complete nervous system exam was not possible PSYCHIATRIC: The patient is alert and oriented x 9 SKIN: No obvious rash, lesion, or ulcer.   LABORATORY PANEL:   CBC Recent Labs  Lab 03/04/18 1141 03/05/18 0058 03/09/18 0844  WBC 11.2* 9.6 17.2*  HGB 12.6* 12.1* 12.0*  HCT 38.1* 36.6* 36.3*  PLT 168 144* 150  MCV 95.7 96.3 95.5  MCH 31.7 31.8 31.6  MCHC 33.1 33.1 33.1  RDW 13.7 13.9 13.8  LYMPHSABS 0.8  --  0.5*  MONOABS 1.0  --  1.1*  EOSABS 0.2  --  0.0  BASOSABS 0.1  --  0.0   ------------------------------------------------------------------------------------------------------------------  Chemistries  Recent Labs  Lab 03/04/18 1141 03/05/18 0058 03/05/18 0422 03/09/18 0844  NA 138 140  --  145  K 4.0 4.0  --  3.2*  CL 104 105  --  107  CO2 24 27  --  27  GLUCOSE 112* 106*  --  172*  BUN 24* 26*  --  35*  CREATININE 0.82 0.73  --  0.96  CALCIUM 11.4* 11.4*  --  12.1*  MG  --   --  2.1  --   AST 41 37  --  43*   ALT 36 34  --  32  ALKPHOS 94 99  --  115  BILITOT 0.7 0.8  --  0.7   ------------------------------------------------------------------------------------------------------------------ estimated creatinine clearance is 46.3 mL/min (by C-G formula based on SCr of 0.96 mg/dL). ------------------------------------------------------------------------------------------------------------------ No results for input(s): TSH, T4TOTAL, T3FREE, THYROIDAB in the last 72 hours.  Invalid input(s): FREET3   Coagulation profile Recent Labs  Lab 03/04/18 1141  INR 1.0   ------------------------------------------------------------------------------------------------------------------- No results for input(s): DDIMER in the last 72 hours. -------------------------------------------------------------------------------------------------------------------  Cardiac Enzymes Recent Labs  Lab 03/09/18 0844  TROPONINI <0.03   ------------------------------------------------------------------------------------------------------------------ Invalid input(s): POCBNP  ---------------------------------------------------------------------------------------------------------------  Urinalysis    Component Value Date/Time   COLORURINE YELLOW (A) 03/09/2018 0844   APPEARANCEUR CLEAR (A) 03/09/2018 0844   LABSPEC 1.019 03/09/2018 0844   PHURINE 5.0 03/09/2018 0844  GLUCOSEU NEGATIVE 03/09/2018 0844   HGBUR MODERATE (A) 03/09/2018 0844   BILIRUBINUR NEGATIVE 03/09/2018 0844   KETONESUR NEGATIVE 03/09/2018 0844   PROTEINUR 30 (A) 03/09/2018 0844   NITRITE NEGATIVE 03/09/2018 0844   LEUKOCYTESUR NEGATIVE 03/09/2018 0844     RADIOLOGY: Dg Chest Port 1 View  Result Date: 03/09/2018 CLINICAL DATA:  Cough and fevers EXAM: PORTABLE CHEST 1 VIEW COMPARISON:  None. FINDINGS: Cardiac shadow is within normal limits. Mild left basilar atelectasis is seen. No for other focal infiltrate is noted. Degenerative  changes of thoracic spine are seen. IMPRESSION: Mild left basilar atelectasis. Electronically Signed   By: Alcide Clever M.D.   On: 03/09/2018 09:03    EKG: Orders placed or performed during the hospital encounter of 03/09/18  . ED EKG  . ED EKG    IMPRESSION AND PLAN:  85 year old elderly male patient with history of Parkinson's disease, arthritis, cerebral amyloid angiopathy referred from [facility] independent facility for confusion, shortness of breath and cough  -Acute hypoxic respiratory failure High flow oxygen Admit patient to stepdown unit Check ABG Secondary to pneumonia  -Sepsis Secondary to pneumonia Broad-spectrum IV antibiotics that is vancomycin and cefepime Follow-up lactic acid level and procalcitonin levels Check cultures  -Healthcare associated pneumonia Continue IV vancomycin and cefepime antibiotics Follow-up cultures  -Acute metabolic encephalopathy Secondary to sepsis and pneumonia Clinical monitoring  -Parkinson's disease Supportive care  -Urinary retention Currently has a Foley catheter Once acute issues are resolved ,trial of voiding and if needed urology consult   All the records are reviewed and case discussed with ED provider. Management plans discussed with the patient, family and they are in agreement.  CODE STATUS:Full code Code Status History    Date Active Date Inactive Code Status Order ID Comments User Context   03/05/2018 0441 03/06/2018 2003 Full Code [id number]  Barbaraann Rondo, MD ED       TOTAL TIME TAKING CARE OF THIS PATIENT: 53 minutes.    Ihor Austin M.D on 03/09/2018 at 10:55 AM  Between 7am to 6pm - Pager - [phone]  After 6pm go to [email or web address] - password [email or web address] [facility]  [place] [facility]  Office  [phone]  CC: Primary care physician; Marisue Ivan, MD

## 2018-03-10 ENCOUNTER — Inpatient Hospital Stay: Payer: Medicare Other

## 2018-03-10 LAB — BASIC METABOLIC PANEL
Anion gap: 7 (ref 5–15)
BUN: 31 mg/dL — ABNORMAL HIGH (ref 8–23)
CO2: 25 mmol/L (ref 22–32)
Calcium: 11.5 mg/dL — ABNORMAL HIGH (ref 8.9–10.3)
Chloride: 113 mmol/L — ABNORMAL HIGH (ref 98–111)
Creatinine, Ser: 0.8 mg/dL (ref 0.61–1.24)
GFR calc Af Amer: >60 mL/min (ref >60)
Glucose, Bld: 132 mg/dL — ABNORMAL HIGH (ref 70–99)
Potassium: 3.4 mmol/L — ABNORMAL LOW (ref 3.5–5.1)
Sodium: 145 mmol/L (ref 135–145)

## 2018-03-10 LAB — CBC
HCT: 32.1 % — ABNORMAL LOW (ref 39.0–52.0)
Hemoglobin: 10.4 g/dL — ABNORMAL LOW (ref 13.0–17.0)
MCH: 31.6 pg (ref 26.0–34.0)
MCHC: 32.4 g/dL (ref 30.0–36.0)
MCV: 97.6 fL (ref 80.0–100.0)
Platelets: 127 10*3/uL — ABNORMAL LOW (ref 150–400)
RBC: 3.29 MIL/uL — ABNORMAL LOW (ref 4.22–5.81)
RDW: 14 % (ref 11.5–15.5)
WBC: 16.5 10*3/uL — ABNORMAL HIGH (ref 4.0–10.5)
nRBC: 0 % (ref 0.0–0.2)

## 2018-03-10 LAB — BLOOD GAS, ARTERIAL
Acid-Base Excess: 4 mmol/L — ABNORMAL HIGH (ref 0.0–2.0)
Bicarbonate: 27.6 mmol/L (ref 20.0–28.0)
FIO2: 1
O2 Saturation: 92.4 %
Patient temperature: 37
pCO2 arterial: 37 mmHg (ref 32.0–48.0)
pH, Arterial: 7.48 — ABNORMAL HIGH (ref 7.350–7.450)
pO2, Arterial: 60 mmHg — ABNORMAL LOW (ref 83.0–108.0)

## 2018-03-10 LAB — GLUCOSE, CAPILLARY: Glucose-Capillary: 110 mg/dL — ABNORMAL HIGH (ref 70–99)

## 2018-03-10 LAB — URINE CULTURE: Culture: NO GROWTH

## 2018-03-10 LAB — MRSA PCR SCREENING: MRSA BY PCR: NEGATIVE

## 2018-03-10 MED ORDER — GLYCOPYRROLATE 0.2 MG/ML IJ SOLN
0.1000 mg | Freq: Four times a day (QID) | INTRAMUSCULAR | Status: DC
  Administered 2018-03-10 – 2018-03-12 (×7): 0.1 mg via INTRAVENOUS
  Filled 2018-03-10 (×11): qty 0.5
  Filled 2018-03-10: qty 1

## 2018-03-10 MED ORDER — SODIUM CHLORIDE 0.9 % IV SOLN
2.0000 g | Freq: Three times a day (TID) | INTRAVENOUS | Status: DC
  Filled 2018-03-10 (×2): qty 2

## 2018-03-10 MED ORDER — MORPHINE SULFATE (PF) 2 MG/ML IV SOLN
2.0000 mg | INTRAVENOUS | Status: DC | PRN
  Administered 2018-03-10: 2 mg via INTRAVENOUS
  Filled 2018-03-10: qty 1

## 2018-03-10 MED ORDER — MORPHINE 100MG IN NS 100ML (1MG/ML) PREMIX INFUSION
1.0000 mg/h | INTRAVENOUS | Status: DC
  Administered 2018-03-10 – 2018-03-12 (×2): 2 mg/h via INTRAVENOUS
  Filled 2018-03-10 (×2): qty 100

## 2018-03-10 MED ORDER — LORAZEPAM 2 MG/ML IJ SOLN
1.0000 mg | INTRAMUSCULAR | Status: DC | PRN
  Administered 2018-03-10: 1 mg via INTRAVENOUS
  Filled 2018-03-10: qty 1

## 2018-03-10 NOTE — Progress Notes (Signed)
Family at bedside; chaplain in. Family verbalizing anticipatory grief; wife soft tearing at intervals with emotion support given. Pt nonresponsive. Resp less congested with pt having peaceful appearance. Frequent mouthcare done. Morphine drip continued.

## 2018-03-10 NOTE — Plan of Care (Signed)
  Problem: Education: Goal: Knowledge of General Education information will improve Description: Including pain rating scale, medication(s)/side effects and non-pharmacologic comfort measures Outcome: Progressing   Problem: Health Behavior/Discharge Planning: Goal: Ability to manage health-related needs will improve Outcome: Progressing   Problem: Clinical Measurements: Goal: Ability to maintain clinical measurements within normal limits will improve Outcome: Progressing Goal: Diagnostic test results will improve Outcome: Progressing Goal: Respiratory complications will improve Outcome: Progressing Goal: Cardiovascular complication will be avoided Outcome: Progressing   Problem: Activity: Goal: Risk for activity intolerance will decrease Outcome: Progressing   Problem: Nutrition: Goal: Adequate nutrition will be maintained Outcome: Progressing   Problem: Coping: Goal: Level of anxiety will decrease Outcome: Progressing   Problem: Elimination: Goal: Will not experience complications related to bowel motility Outcome: Progressing   Problem: Pain Managment: Goal: General experience of comfort will improve Outcome: Progressing   Problem: Safety: Goal: Ability to remain free from injury will improve Outcome: Progressing   Problem: Skin Integrity: Goal: Risk for impaired skin integrity will decrease Outcome: Progressing   

## 2018-03-10 NOTE — Progress Notes (Signed)
Pastoral Care Visit   03/10/18 1345  Clinical Encounter Type  Visited With Patient and family together  Visit Type Initial;Spiritual support;Psychological support;Patient actively dying  Referral From Nurse  Consult/Referral To Chaplain  Spiritual Encounters  Spiritual Needs Prayer;Emotional  Stress Factors  Patient Stress Factors Not reviewed  Family Stress Factors Loss;Major life changes  Chap paged to comfort wife of pt who is on comfort care.  Chap listened to pt's wife, affirmed her decision to stop life saving care at this time, and prayed w/ pt's wife.

## 2018-03-10 NOTE — Progress Notes (Signed)
   [facility] - [place] at [facility]   Advance care planning  Hospital Day: 1 day Jwan Millay is a 84 y.o. male with hx of advanced Parkinson's disease, cerebral amyloid angiopathy, presenting with shortness of breath, hypoxia and Weakness  This morning patient's respiratory status got worse and he was noted to be more hypoxic with O2 sats in the 80s.  Patient was transferred to stepdown level of care.  Upon arrival patient in moderate respiratory distress with wife and son at bedside.  I had an extensive discussion with the patient's wife and son at bedside about goals of care.  I explained to the patient's wife and son about difference between DNR and comfort care only.  Patient's family does not want to pursue any further aggressive care and wants to keep the patient comfortable.  Comfort care orders will be placed.  Patient will be started on as needed IV Ativan, morphine and Robinul.  Advance care planning discussed with patient  with additional Family at bedside. All questions in regards to overall condition and expected prognosis answered. The decision was made to change current code status  CODE STATUS: Comfort Care Only.  Time spent: 18 minutes

## 2018-03-10 NOTE — Progress Notes (Signed)
Earlier in shift notified hospitalist of patient having a temp of 100.1 ax. Not quite meeting parameters but patient does not have any tylenol to give per rectum PRN. Ordiers put for tylenol PRN per rectal for patient is currently NPO for Swallowing study eval. Rechecked temperature this morning via axillary and is 100.6. Administered Tylenol per rectum. Patient is currently resting with head of bed upright at about 30 degrees. Will continue to monitor and will recheck temperature.

## 2018-03-10 NOTE — Progress Notes (Signed)
Pt returned for step down with pt changed to DNR/Comfort care. Family at bedside. Morphine drip infusing with pt calm, restful appearance, respirations wet, shallow with gurgle with expirations. Emotional support provided. Chaplain consult submitted.

## 2018-03-10 NOTE — Progress Notes (Signed)
Pt arrived to unit via RR on NRB-15L. Pt has very coarse lung sounds. SpO2 100%. Pt nonverbal. Facial grimace to voice/pain. NSR on cardiac monitor. Wife and son at bedside expressing concern for goals of care. MD Sainani made aware of families wishes to place DNR/CC. Orders for morphine and Ativan for pt comfort. Pt with orders to transfer back to med surg.

## 2018-03-10 NOTE — Progress Notes (Signed)
[facility] [place] at [facility]     PATIENT NAME: Darrell Fry    MR#:  [id number]  DATE OF BIRTH:  Feb 13, 1927  SUBJECTIVE:   Patient admitted yesterday for shortness of breath, weakness and suspected aspiration pneumonia.  This morning patient was in severe respiratory distress, and hypoxic, rapid response was called and patient was transferred to the ICU.  Upon further discussions with the patient's family they did not want to pursue aggressive care and therefore patient was made comfort care only.  REVIEW OF SYSTEMS:    Review of Systems  Unable to perform ROS: Mental acuity    Nutrition: NPO Tolerating Diet: No  DRUG ALLERGIES:  No Known Allergies  VITALS:  Blood pressure (!) 156/72, pulse 93, temperature 99.1 F (37.3 C), temperature source Axillary, resp. rate (!) 23, height 5\' 9"  (1.753 m), weight 71.6 kg, SpO2 91 %.  PHYSICAL EXAMINATION:   Physical Exam  GENERAL:  85 y.o.-year-old patient lethargic/Encephalopathic with significant upper airway rattling.  EYES:  No scleral icterus. HEENT: Head atraumatic, normocephalic. Oropharynx and nasopharynx clear.  NECK:  Supple, no jugular venous distention. No thyroid enlargement, no tenderness.  LUNGS: Significant upper airway rattling, positive use of accessory muscles diffuse rhonchi bilaterally. CARDIOVASCULAR: S1, S2 normal. No murmurs, rubs, or gallops.  ABDOMEN: Soft, nontender, nondistended. Bowel sounds present. No organomegaly or mass.  EXTREMITIES: No cyanosis, clubbing or edema b/l.    NEUROLOGIC: Lethargic, Encephalopathic.   PSYCHIATRIC: Lethargic, Encephalopathic SKIN: No obvious rash, lesion, or ulcer.    LABORATORY PANEL:   CBC Recent Labs  Lab 03/10/18 0547  WBC 16.5*  HGB 10.4*  HCT 32.1*  PLT 127*   ------------------------------------------------------------------------------------------------------------------  Chemistries  Recent Labs  Lab 03/05/18 0422  03/09/18 0844  03/10/18 0547  NA  --  145  --  145  K  --  3.2*  --  3.4*  CL  --  107  --  113*  CO2  --  27  --  25  GLUCOSE  --  172*  --  132*  BUN  --  35*  --  31*  CREATININE  --  0.96   < > 0.80  CALCIUM  --  12.1*  --  11.5*  MG 2.1  --   --   --   AST  --  43*  --   --   ALT  --  32  --   --   ALKPHOS  --  115  --   --   BILITOT  --  0.7  --   --    < > = values in this interval not displayed.   ------------------------------------------------------------------------------------------------------------------  Cardiac Enzymes Recent Labs  Lab 03/09/18 0844  TROPONINI <0.03   ------------------------------------------------------------------------------------------------------------------  RADIOLOGY:  Dg Chest Port 1 View  Result Date: 03/10/2018 CLINICAL DATA:  Respiratory distress EXAM: PORTABLE CHEST 1 VIEW COMPARISON:  Yesterday FINDINGS: Interstitial coarsening with airway cuffing. No Kerley lines or effusion. Normal heart size and mediastinal contours. Atherosclerosis and spondylosis. IMPRESSION: Bronchitic interstitial coarsening.  No focal consolidation. Electronically Signed   By: Marnee Spring M.D.   On: 03/10/2018 08:50   Dg Chest Port 1 View  Result Date: 03/09/2018 CLINICAL DATA:  Cough and fevers EXAM: PORTABLE CHEST 1 VIEW COMPARISON:  None. FINDINGS: Cardiac shadow is within normal limits. Mild left basilar atelectasis is seen. No for other focal infiltrate is noted. Degenerative changes of thoracic spine are seen. IMPRESSION: Mild left basilar atelectasis. Electronically  Signed   By: Alcide Clever M.D.   On: 03/09/2018 09:03     ASSESSMENT AND PLAN:   85 year old male with past medical history of advanced Parkinson's, cerebral amyloid angiopathy, hypertension who was admitted to the hospital yesterday due to shortness of breath, weakness and suspected to have aspiration pneumonia.  1.  Acute respiratory failure with hypoxia- secondary to  pneumonia/aspiration pneumonitis. 2.  Aspiration pneumonia/pneumonitis. 3.  Acute metabolic encephalopathy-secondary to pneumonia with underlying Parkinson's. 4.  Advanced Parkinson's disease. 5.  Urinary retention status post Foley catheter.  Patient was admitted to the hospital due to respiratory distress and placed on IV antibiotics and supportive care.  Patient this morning was having significant respiratory distress and therefore rapid response called and patient was transferred to the ICU.  Upon arriving to the ICU had a long discussion with the patient's wife and son about goals of care and due to patient's advanced Parkinson's and progressive decline they did not want to pursue any further aggressive care.  Patient's family agreed on making the patient comfortable and comfort care orders were placed. - IV morphine drip ordered, IV Ativan PRN ordered.  Continue Robinul and scopolamine patch.  All the records are reviewed and case discussed with Care Management/Social Worker. Management plans discussed with the patient, family and they are in agreement.  CODE STATUS: DNR/comfort care  TOTAL TIME TAKING CARE OF THIS PATIENT: 35 minutes.   POSSIBLE D/C to Hospice Home if able    Houston Siren M.D on 03/10/2018 at 1:42 PM  Between 7am to 6pm - Pager - [phone]  After 6pm go to [email or web address] - Social research officer, government  [facility] [place] [facility]  Office  [phone]  CC: Primary care physician; Marisue Ivan, MD

## 2018-03-10 NOTE — Progress Notes (Signed)
Pharmacy Antibiotic Note  Darrell Fry is a 85 y.o. male admitted on 03/09/2018 with pneumonia.  Pharmacy has been consulted for Vancomycin and Cefepime dosing.  Plan: Cefepime 2g IV every 12 hours. Will change to every 8 hours due to improved renal function.  Vancomycin 1g given in ED, followed by 1250mg  IV every 24 hours. Goal AUC 400-550. Expected AUC: 499.5 SCr used: 0.96  Pharmacy to monitor and adjust as per protocol.   Height: 5\' 9"  (175.3 cm) Weight: 157 lb 13.6 oz (71.6 kg) IBW/kg (Calculated) : 70.7  Temp (24hrs), Avg:99.7 F (37.6 C), Min:97.7 F (36.5 C), Max:100.8 F (38.2 C)  Recent Labs  Lab 03/04/18 1141 03/05/18 0058 03/09/18 0844 03/09/18 1047 03/09/18 1312 03/10/18 0547  WBC 11.2* 9.6 17.2*  --  15.3* 16.5*  CREATININE 0.82 0.73 0.96  --  0.80 0.80  LATICACIDVEN  --   --  3.0* 2.3* 2.0*  --     Estimated Creatinine Clearance: 63.8 mL/min (by C-G formula based on SCr of 0.8 mg/dL).    No Known Allergies  Antimicrobials this admission: Cefepime 3/7 >>  Vancomycin 3/7 >>   Dose adjustments this admission: 3/8 Cefepime 2g IV Q12H >> Q8H  Microbiology results: 3/7 BCx: NG x 2 3/7 UCx: pending  3/7 Sputum: pending  3/7 MRSA PCR: pending  Thank you for allowing pharmacy to be a part of this patient's care.  Stormy Card, [facility] 03/10/2018 7:39 AM

## 2018-03-10 NOTE — Significant Event (Signed)
Rapid Response Event Note  Overview: Time Called: 0737 Arrival Time: 0739 Event Type: Respiratory  Initial Focused Assessment: called for RR on pt who is in resp distress. Laying in bed on NRB   Interventions:Spoke with Dr Cherlynn Kaiser, stat abg, cxr and transfer to stepdown. Floor RN Erskine Squibb called family to make them aware. Subsequently made comfort care upon family arrival and discussion with Dr Cherlynn Kaiser.  Plan of Care (if not transferred):  Event Summary: Name of Physician Notified: Sainani at 32  Name of Consulting Physician Notified: Jayme Cloud at 0800  Outcome: Transferred (Comment)  Event End Time: 0815  Kyley Solow A

## 2018-03-10 NOTE — Progress Notes (Signed)
Pt found having shallow,rapid respirations with upper airway congestion; color flushed; occasional weak cough with infrequent production. Pulse ox 78% on 02@ 2lnc  02 increased without correction.suction performed with small amount white sputum noted.Yankauer suction performed with small amount white sputum noted.  Respiratory called and in with 100% nonbreather mask applied with sats still running in the 80's. Dr. Cherlynn Kaiser notified. Rapid response called. TC with wife and updated on change in respiratory status and is coming in. RTC from Dr. Cherlynn Kaiser with orders obtained. Pt transferred in bed to stepdown unit, RM 8 accompanied by rapid response team, Katie RN and respiratory.

## 2018-03-11 NOTE — Clinical Social Work Note (Signed)
Patient is from [facility], [place]. Family has requested that patient go to hospice home in [place]. CSW notified Clydie Braun, hospice liaison of referral. CSW will continue to follow for discharge planning.   Ruthe Mannan MSW, [address] [phone]

## 2018-03-11 NOTE — Progress Notes (Signed)
[facility] - [place] at [facility]     PATIENT NAME: Ashkon Romanowski    MR#:  [id number]  DATE OF BIRTH:  1927/10/23  SUBJECTIVE:   No acute events overnight, patient remains on a morphine drip and appears quite comfortable.  REVIEW OF SYSTEMS:    Review of Systems  Unable to perform ROS: Mental acuity    Nutrition: NPO Tolerating Diet: No  DRUG ALLERGIES:  No Known Allergies  VITALS:  Blood pressure (!) 132/59, pulse 65, temperature 98.2 F (36.8 C), resp. rate 18, height 5\' 9"  (1.753 m), weight 71.6 kg, SpO2 96 %.  PHYSICAL EXAMINATION:   Physical Exam  GENERAL:  85 y.o.-year-old patient lethargic/Encephalopathic but in NAD.  EYES:  No scleral icterus. HEENT: Head atraumatic, normocephalic.  NECK:  Supple, no jugular venous distention. No thyroid enlargement, no tenderness.  LUNGS: No Use of accessory muscles, No rhonchi or wheezing. CARDIOVASCULAR: S1, S2 normal. No murmurs, rubs, or gallops.  ABDOMEN: Soft, nontender, nondistended. Bowel sounds present. No organomegaly or mass.  EXTREMITIES: No cyanosis, clubbing or edema b/l.    NEUROLOGIC: Lethargic, Encephalopathic.   PSYCHIATRIC: Lethargic, Encephalopathic SKIN: No obvious rash, lesion, or ulcer.     LABORATORY PANEL:   CBC Recent Labs  Lab 03/10/18 0547  WBC 16.5*  HGB 10.4*  HCT 32.1*  PLT 127*   ------------------------------------------------------------------------------------------------------------------  Chemistries  Recent Labs  Lab 03/05/18 0422 03/09/18 0844  03/10/18 0547  NA  --  145  --  145  K  --  3.2*  --  3.4*  CL  --  107  --  113*  CO2  --  27  --  25  GLUCOSE  --  172*  --  132*  BUN  --  35*  --  31*  CREATININE  --  0.96   < > 0.80  CALCIUM  --  12.1*  --  11.5*  MG 2.1  --   --   --   AST  --  43*  --   --   ALT  --  32  --   --   ALKPHOS  --  115  --   --   BILITOT  --  0.7  --   --    < > = values in this interval not displayed.    ------------------------------------------------------------------------------------------------------------------  Cardiac Enzymes Recent Labs  Lab 03/09/18 0844  TROPONINI <0.03   ------------------------------------------------------------------------------------------------------------------  RADIOLOGY:  Dg Chest Port 1 View  Result Date: 03/10/2018 CLINICAL DATA:  Respiratory distress EXAM: PORTABLE CHEST 1 VIEW COMPARISON:  Yesterday FINDINGS: Interstitial coarsening with airway cuffing. No Kerley lines or effusion. Normal heart size and mediastinal contours. Atherosclerosis and spondylosis. IMPRESSION: Bronchitic interstitial coarsening.  No focal consolidation. Electronically Signed   By: Marnee Spring M.D.   On: 03/10/2018 08:50     ASSESSMENT AND PLAN:   85 year old male with past medical history of advanced Parkinson's, cerebral amyloid angiopathy, hypertension who was admitted to the hospital yesterday due to shortness of breath, weakness and suspected to have aspiration pneumonia.  1.  Acute respiratory failure with hypoxia- secondary to pneumonia/aspiration pneumonitis. 2.  Aspiration pneumonia/pneumonitis. 3.  Acute metabolic encephalopathy-secondary to pneumonia with underlying Parkinson's. 4.  Advanced Parkinson's disease. 5.  Urinary retention status post Foley catheter.  Patient was admitted to the hospital due to respiratory distress and placed on IV antibiotics and supportive care.  Patient was having significant respiratory distress and therefore rapid response called and patient was  transferred to the ICU yesterday.  I had a long discussion with the patient's wife and son about goals of care and due to patient's advanced Parkinson's and progressive decline they did not want to pursue any further aggressive care.  Patient's family agreed on making the patient comfortable and comfort care orders were placed. - cont. IV morphine drip, IV Ativan PRN ordered.   Continue Robinul and scopolamine patch.  Possible d/c to Hospice Home when bed available.   All the records are reviewed and case discussed with Care Management/Social Worker. Management plans discussed with the patient, family and they are in agreement.  CODE STATUS: DNR/comfort care  TOTAL TIME TAKING CARE OF THIS PATIENT: 25 minutes.   POSSIBLE D/C to Hospice Home if able    Houston Siren M.D on 03/11/2018 at 2:14 PM  Between 7am to 6pm - Pager - [phone]  After 6pm go to [email or web address] - Social research officer, government  [facility] [place] [facility]  Office  [phone]  CC: Primary care physician; Marisue Ivan, MD

## 2018-03-11 NOTE — Progress Notes (Signed)
New hospice home referral received from [place]. Patient is an 85 year old man with a history of Parkinson's disease, dementia, gout, arthritis, cerebral amyloid angiopathy admitted to [facility] from [facility] on 3/7 with increased dyspnea, abdominal distention and cough. Foley catheter placed in the ED, r/t urinary retention. CXR showed pneumonia and patient was  started on IV antibiotics also required supplemental oxygen for decreased oxygen saturations. On 3/8 a rapid response was called d/t patient having shallow rapid respirations and oxygen saturations on 78%, he was subsequently transferred to the ICU. After a conversation with the attending physician, patient's family chose to focus on comfort and a morphine drip was initiated.  Following a conversation with attending physician today, family has requested transfer to the hospice home.  Writer met in the room with Mrs. Cherubini and her son Herbie Baltimore to initiate education regarding hospice services, philosophy and team approach to care with understanding voiced. Questions answered. Tentative plan is for transfer to the hospice home late tomorrow afternoon. Hospital care team updated. Patient information faxed to referral. Hospice information and contact numbers given to Mrs. Naida Sleight.  Flo Shanks BSN, RN, [facility] Liaison [facility] [phone]

## 2018-03-11 NOTE — Progress Notes (Signed)
Nutrition Brief Note  RD drawn to pt due to MST score of 2. Chart reviewed. Pt now transitioning to comfort care.  No nutrition interventions warranted at this time.  Please consult as needed.   Earma Reading, MS, RD, LDN Inpatient Clinical Dietitian Pager: [phone] Weekend/After Hours: [phone]

## 2018-03-12 LAB — CULTURE, RESPIRATORY W GRAM STAIN: Culture: NORMAL

## 2018-03-12 LAB — CULTURE, RESPIRATORY

## 2018-03-12 MED ORDER — MORPHINE 100MG IN NS 100ML (1MG/ML) PREMIX INFUSION: 1.0000 mg/h | INTRAVENOUS | Status: DC

## 2018-03-12 MED ORDER — MORPHINE SULFATE (PF) 2 MG/ML IV SOLN: 2.0000 mg | INTRAVENOUS | 0 refills | Status: AC | PRN

## 2018-03-12 MED ORDER — LORAZEPAM 2 MG/ML IJ SOLN: 1.0000 mg | INTRAMUSCULAR | 0 refills | Status: AC | PRN

## 2018-03-12 MED ORDER — GLYCOPYRROLATE 0.2 MG/ML IJ SOLN: 0.1000 mg | Freq: Four times a day (QID) | INTRAMUSCULAR | Status: AC

## 2018-03-12 NOTE — Progress Notes (Signed)
Pastoral Care Visit    03/12/18 1500  Clinical Encounter Type  Visited With Patient and family together (wife Tobi Bastos)  Visit Type Follow-up;Psychological support;Spiritual support  Referral From Family  Consult/Referral To Chaplain  Spiritual Encounters  Spiritual Needs Emotional;Prayer  Stress Factors  Patient Stress Factors Not reviewed  Family Stress Factors Loss   Pt was catatonic during visit.  Pt wife, Tobi Bastos, warmly received chap visit, informed chap that pt is being moved to hospice house soon. Pt wife is sad but accepts this as the way of life.  Mirna Mires provided compassionate presence, empathy, and prayed w/ pt wife.    Milinda Antis, [address]

## 2018-03-12 NOTE — Care Management Important Message (Signed)
Important Message  Patient Details  Name: Darrell Fry MRN: [id number] Date of Birth: February 01, 1927   Medicare Important Message Given:       Verita Schneiders Tj Kitchings 03/12/2018, 10:54 AM

## 2018-03-12 NOTE — Discharge Summary (Signed)
[facility] [place] [facility]   PATIENT NAME: Darrell NormanUwe Fry    MR#:  [id number]  DATE OF BIRTH:  08/28/1927  DATE OF ADMISSION:  03/09/2018 ADMITTING PHYSICIAN: Ihor AustinPavan Pyreddy, MD  DATE OF DISCHARGE: 03/12/2018  PRIMARY CARE PHYSICIAN: Marisue IvanLinthavong, Kanh[place], MD    ADMISSION DIAGNOSIS:  Sepsis, due to unspecified organism, unspecified whether acute organ dysfunction present (HCC) [A41.9] Acute respiratory failure (HCC) [J96.00]  DISCHARGE DIAGNOSIS:  Active Problems:   HCAP (healthcare-associated pneumonia)   Acute respiratory failure (HCC)   SECONDARY DIAGNOSIS:   Past Medical History:  Diagnosis Date  . Arthritis    knees  . Cerebral amyloid angiopathy (HCC)   . Parkinson's disease (HCC)    no meds  . Parkinson's disease dementia (HCC)   . REM sleep behavior disorder   . Wears hearing aid in both ears     HOSPITAL COURSE:   85 year old male with past medical history of advanced Parkinson's, cerebral amyloid angiopathy, hypertension who was admitted to the hospital yesterday due to shortness of breath, weakness and suspected to have aspiration pneumonia.  1.  Acute respiratory failure with hypoxia- secondary to pneumonia/aspiration pneumonitis. 2.  Aspiration pneumonia/pneumonitis. 3.  Acute metabolic encephalopathy-secondary to pneumonia with underlying Parkinson's. 4.  Advanced Parkinson's disease. 5.  Urinary retention status post Foley catheter.  Patient was admitted to the hospital due to respiratory distress and placed on IV antibiotics and supportive care.  Patient was having significant respiratory distress and therefore rapid response called and patient was transferred to the ICU but shortly thereafter  I had a long discussion with the patient's wife and son about goals of care and due to patient's advanced Parkinson's and progressive decline they did not want to pursue any further aggressive care.  Patient's family agreed on making the patient  comfortable and comfort care orders were placed. Patient was placed on IV pain drip, given as needed IV morphine, and also Robinul and scopolamine patch. -Patient is currently stable and being discharged to hospice home on current meds.    DISCHARGE CONDITIONS:   Stable.   CONSULTS OBTAINED:    DRUG ALLERGIES:  No Known Allergies  DISCHARGE MEDICATIONS:   Allergies as of 03/12/2018   No Known Allergies     Medication List    STOP taking these medications   allopurinol 300 MG tablet Commonly known as:  ZYLOPRIM   cholecalciferol 25 MCG (1000 UT) tablet Commonly known as:  VITAMIN D   ipratropium-albuterol 0.5-2.5 (3) MG/3ML Soln Commonly known as:  DUONEB   LORazepam 0.5 MG tablet Commonly known as:  ATIVAN Replaced by:  LORazepam 2 MG/ML injection   PRESERVISION AREDS 2 PO   Prevagen 10 MG Caps Generic drug:  Apoaequorin   tamsulosin 0.4 MG Caps capsule Commonly known as:  FLOMAX   timolol 0.25 % ophthalmic solution Commonly known as:  BETIMOL     TAKE these medications   acetaminophen 650 MG suppository Commonly known as:  TYLENOL Place 650 mg rectally every 4 (four) hours as needed.   glycopyrrolate 0.2 MG/ML injection Commonly known as:  ROBINUL Inject 0.5 mLs (0.1 mg total) into the vein 4 (four) times daily.   LORazepam 2 MG/ML injection Commonly known as:  ATIVAN Inject 0.5 mLs (1 mg total) into the vein every 4 (four) hours as needed for anxiety or sedation. Replaces:  LORazepam 0.5 MG tablet   morphine 2 MG/ML injection Inject 1 mL (2 mg total) into the vein every 2 (two) hours  as needed.   scopolamine 1 MG/3DAYS Commonly known as:  TRANSDERM-SCOP Place 1 patch onto the skin every 3 (three) days.         DISCHARGE INSTRUCTIONS:   DIET:  NPO  DISCHARGE CONDITION:  Fair  ACTIVITY:  Bedrest  OXYGEN:  Home Oxygen: Yes.     Oxygen Delivery: 2 liters/min via Patient connected to nasal cannula oxygen  DISCHARGE LOCATION:   Hospice Home   If you experience worsening of your admission symptoms, develop shortness of breath, life threatening emergency, suicidal or homicidal thoughts you must seek medical attention immediately by calling 911 or calling your MD immediately  if symptoms less severe.  You Must read complete instructions/literature along with all the possible adverse reactions/side effects for all the Medicines you take and that have been prescribed to you. Take any new Medicines after you have completely understood and accpet all the possible adverse reactions/side effects.   Please note  You were cared for by a hospitalist during your hospital stay. If you have any questions about your discharge medications or the care you received while you were in the hospital after you are discharged, you can call the unit and asked to speak with the hospitalist on call if the hospitalist that took care of you is not available. Once you are discharged, your primary care physician will handle any further medical issues. Please note that NO REFILLS for any discharge medications will be authorized once you are discharged, as it is imperative that you return to your primary care physician (or establish a relationship with a primary care physician if you do not have one) for your aftercare needs so that they can reassess your need for medications and monitor your lab values.     Today   No acute events overnight.  Remains on a morphine drip and appears comfortable.  VITAL SIGNS:  Blood pressure (!) 143/61, pulse 64, temperature 100.1 F (37.8 C), temperature source Axillary, resp. rate 16, height 5\' 9"  (1.753 m), weight 71.6 kg, SpO2 95 %.  I/O:    Intake/Output Summary (Last 24 hours) at 03/12/2018 1220 Last data filed at 03/12/2018 0454 Gross per 24 hour  Intake 63.36 ml  Output 900 ml  Net -836.64 ml    PHYSICAL EXAMINATION:   GENERAL:  85 y.o.-year-old patient lethargic/Encephalopathic but in NAD.  EYES:   No scleral icterus. HEENT: Head atraumatic, normocephalic.  NECK:  Supple, no jugular venous distention. No thyroid enlargement, no tenderness.  LUNGS: No Use of accessory muscles, No rhonchi or wheezing. CARDIOVASCULAR: S1, S2 normal. No murmurs, rubs, or gallops.  ABDOMEN: Soft, nontender, nondistended. Bowel sounds present. No organomegaly or mass.  EXTREMITIES: No cyanosis, clubbing or edema b/l.    NEUROLOGIC: Sedated, Encephalopathic.   PSYCHIATRIC: Sedated, Encephalopathic SKIN: No obvious rash, lesion, or ulcer.     DATA REVIEW:   CBC Recent Labs  Lab 03/10/18 0547  WBC 16.5*  HGB 10.4*  HCT 32.1*  PLT 127*    Chemistries  Recent Labs  Lab 03/09/18 0844  03/10/18 0547  NA 145  --  145  K 3.2*  --  3.4*  CL 107  --  113*  CO2 27  --  25  GLUCOSE 172*  --  132*  BUN 35*  --  31*  CREATININE 0.96   < > 0.80  CALCIUM 12.1*  --  11.5*  AST 43*  --   --   ALT 32  --   --  ALKPHOS 115  --   --   BILITOT 0.7  --   --    < > = values in this interval not displayed.    Cardiac Enzymes Recent Labs  Lab 03/09/18 0844  TROPONINI <0.03    Microbiology Results  Results for orders placed or performed during the hospital encounter of 03/09/18  MRSA PCR Screening     Status: None   Collection Time: 03/09/18  5:52 AM  Result Value Ref Range Status   MRSA by PCR NEGATIVE NEGATIVE Final    Comment:        The GeneXpert MRSA Assay (FDA approved for NASAL specimens only), is one component of a comprehensive MRSA colonization surveillance program. It is not intended to diagnose MRSA infection nor to guide or monitor treatment for MRSA infections. Performed at [facility], [address], [place], [place] [address]   Urine culture     Status: None   Collection Time: 03/09/18  8:43 AM  Result Value Ref Range Status   Specimen Description   Final    URINE, RANDOM Performed at [facility], [address], [place], [place] [address]     Special Requests   Final    NONE Performed at [facility], [address] [place], [place] [address]    Culture   Final    NO GROWTH Performed at [facility], [address] [place], [place] [address]    Report Status 03/10/2018 FINAL  Final  Blood Culture (routine x 2)     Status: None (Preliminary result)   Collection Time: 03/09/18  8:44 AM  Result Value Ref Range Status   Specimen Description BLOOD BLOOD RIGHT WRIST  Final   Special Requests   Final    BOTTLES DRAWN AEROBIC AND ANAEROBIC Blood Culture adequate volume   Culture   Final    NO GROWTH 3 DAYS Performed at [facility], [address] [place], [place] [address]    Report Status PENDING  Incomplete  Blood Culture (routine x 2)     Status: None (Preliminary result)   Collection Time: 03/09/18  8:44 AM  Result Value Ref Range Status   Specimen Description BLOOD BLOOD LEFT HAND  Final   Special Requests   Final    BOTTLES DRAWN AEROBIC AND ANAEROBIC Blood Culture adequate volume   Culture   Final    NO GROWTH 3 DAYS Performed at [facility], [address], [place], [place] [address]    Report Status PENDING  Incomplete  Culture, respiratory     Status: None   Collection Time: 03/09/18 12:44 PM  Result Value Ref Range Status   Specimen Description   Final    TRACHEAL ASPIRATE Performed at [facility], [address] [place], [place] [address]    Special Requests   Final    NONE Performed at [facility], [address] [place], [place] [address]    Gram Stain   Final    FEW WBC PRESENT, PREDOMINANTLY PMN RARE SQUAMOUS EPITHELIAL CELLS PRESENT MODERATE GRAM NEGATIVE COCCOBACILLI RARE GRAM POSITIVE RODS    Culture   Final    FEW Consistent with normal respiratory flora. Performed at [facility], [address], [place], [place] [address]    Report Status 03/12/2018 FINAL  Final    RADIOLOGY:  No results found.    Management  plans discussed with the patient, family and they are in agreement.  CODE STATUS:     Code Status Orders  (From admission, onward)         Start     Ordered   03/10/18 0906  Do not attempt resuscitation (DNR)  Continuous    Question Answer Comment  In the event of cardiac or respiratory ARREST Do not call a "code blue"   In the event of cardiac or respiratory ARREST Do not perform Intubation, CPR, defibrillation or ACLS   In the event of cardiac or respiratory ARREST Use medication by any route, position, wound care, and other measures to relive pain and suffering. May use oxygen, suction and manual treatment of airway obstruction as needed for comfort.      03/10/18 0906         TOTAL TIME TAKING CARE OF THIS PATIENT: 40 minutes.    Houston Siren M.D on 03/12/2018 at 12:20 PM  Between 7am to 6pm - Pager - [phone]  After 6pm go to [email or web address] - Social research officer, government  [facility]  Office  [phone]  CC: Primary care physician; Marisue Ivan, MD

## 2018-03-12 NOTE — Progress Notes (Signed)
Follow up visit made. Patient seen lying in bed, no response to verbal or tactile stimuli. Remains on continuous morphine drip at 2 mg/hr, scheduled Robinul and scopolamine patch in place. Plan is for discharge to the hospice home today. Wife at bedside, in agreement with transfer. Discharge summary faxed to referral. EMS notified for 5 pm pick up. Report called to the hospice home.  Will continue to follow thru final discharge.  Dayna Barker BSN, RN, [facility] Liaison [facility] [phone]

## 2018-03-14 LAB — CULTURE, BLOOD (ROUTINE X 2)
Culture: NO GROWTH
Culture: NO GROWTH
SPECIAL REQUESTS: ADEQUATE
Special Requests: ADEQUATE

## 2018-03-14 LAB — LEGIONELLA PNEUMOPHILA SEROGP 1 UR AG: L. pneumophila Serogp 1 Ur Ag: NEGATIVE

## 2018-04-03 DEATH — deceased

## 2021-02-10 IMAGING — CT CT HEAD W/O CM
3 series · 16 of 47 positions shown, 19 images · non-contrast
Comparison: MRI 09/27/2017

CLINICAL DATA: Speech difficulty.  Possible stroke.

EXAM:
CT HEAD WITHOUT CONTRAST
TECHNIQUE: Contiguous axial images were obtained from the base of the skull
through the vertex without intravenous contrast.

[Series 2: head wo · axial · 0.44mm/px · z∈[-158,-28]mm · 10 of 32 slices shown, 13 images]
[im 3/32  brain]
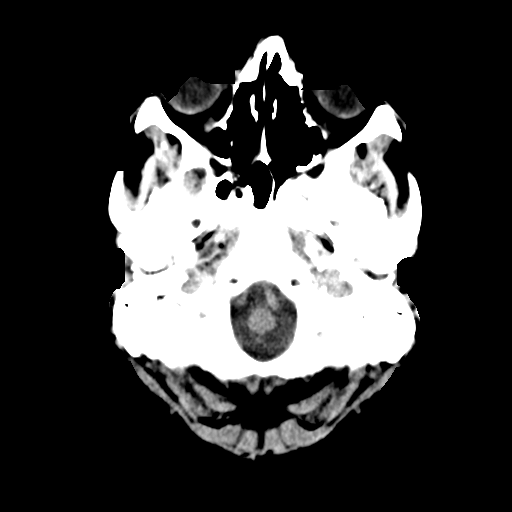
[im 3/32  bone]
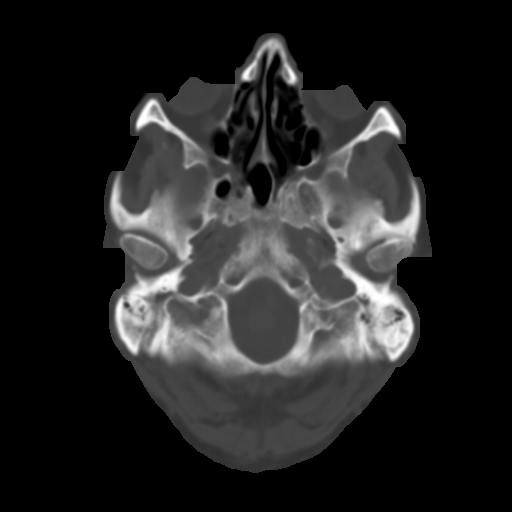
[im 6/32  brain]
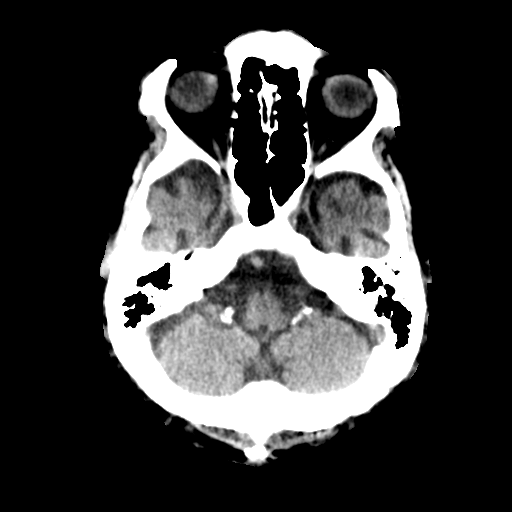
[im 9/32  brain]
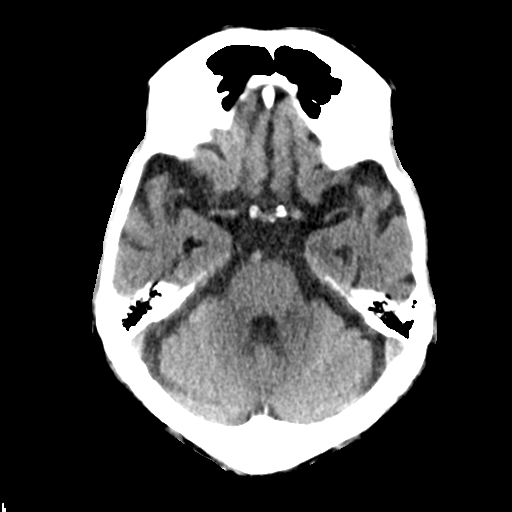
[im 11/32  brain]
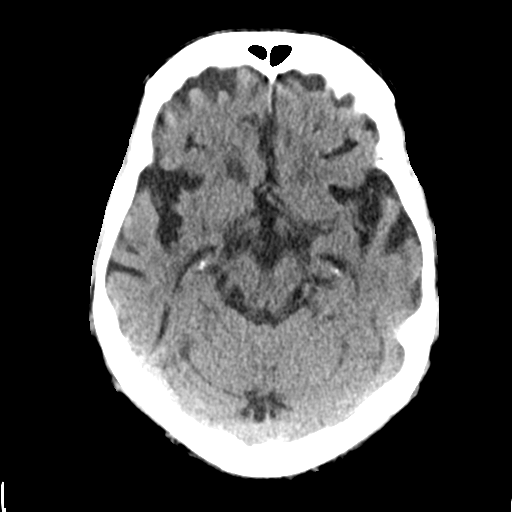
[im 14/32  brain]
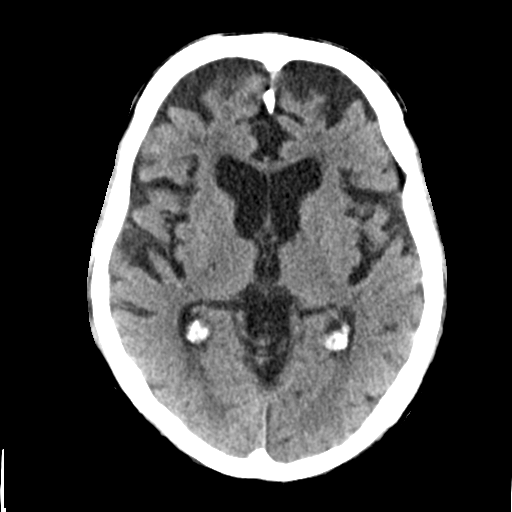
[im 14/32  bone]
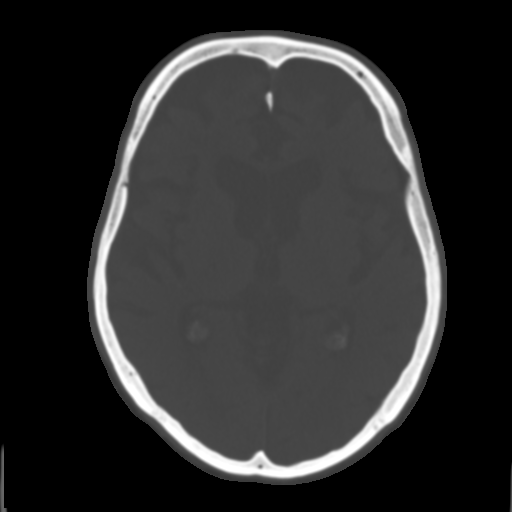
[im 18/32  brain]
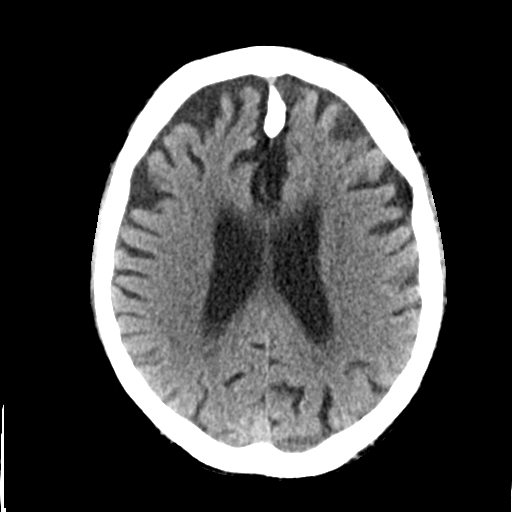
[im 21/32  brain]
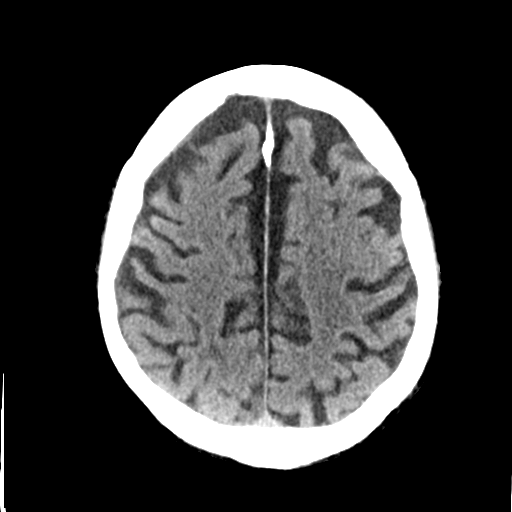
[im 24/32  brain]
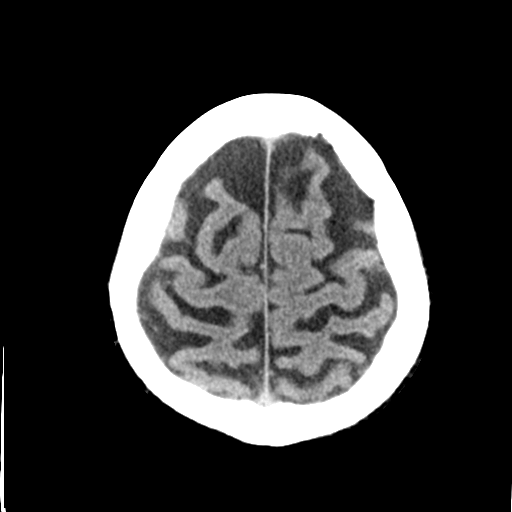
[im 26/32  brain]
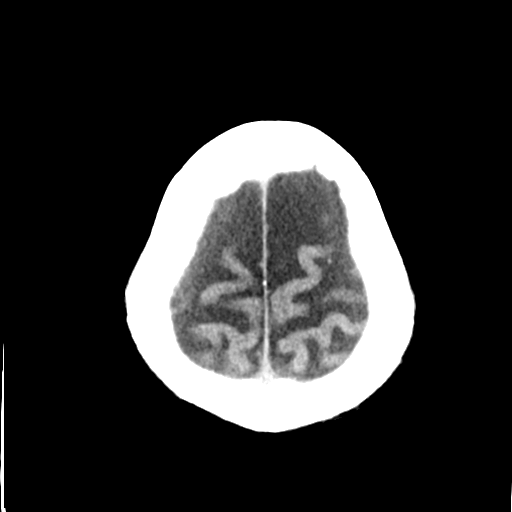
[im 26/32  bone]
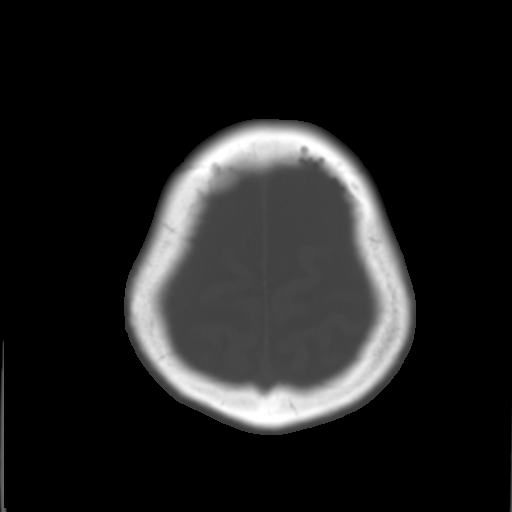
[im 29/32  brain]
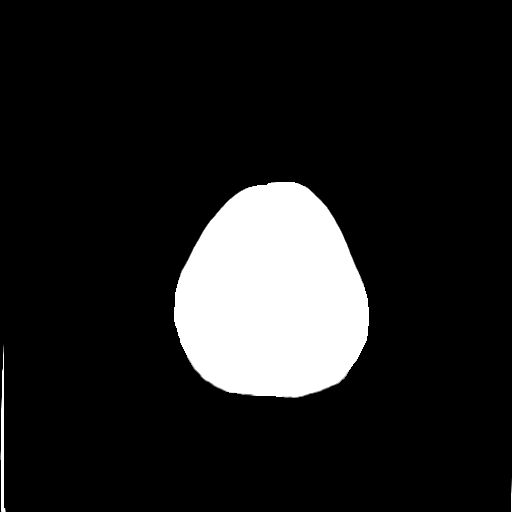

[Series 4: coronal soft tissue · coronal · 0.32mm/px · 3 of 68 slices shown]
[im 23/68  brain]
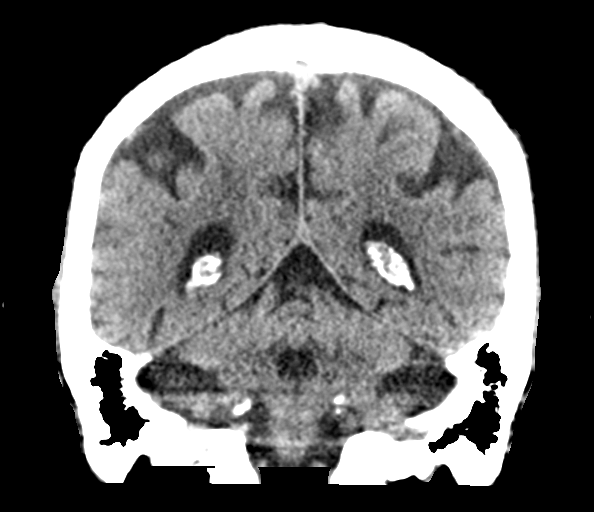
[im 30/68  brain]
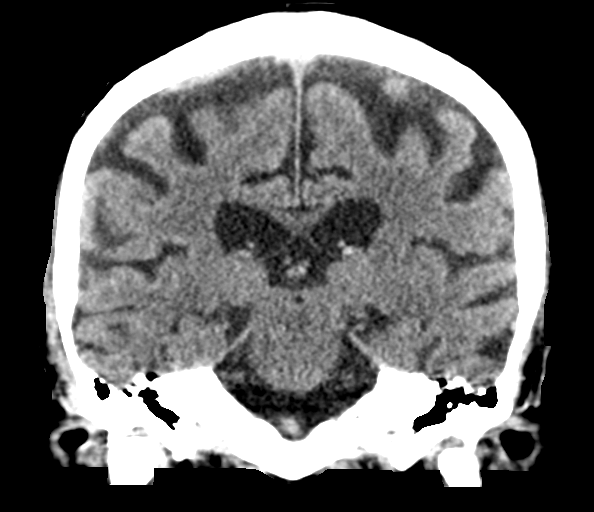
[im 38/68  brain]
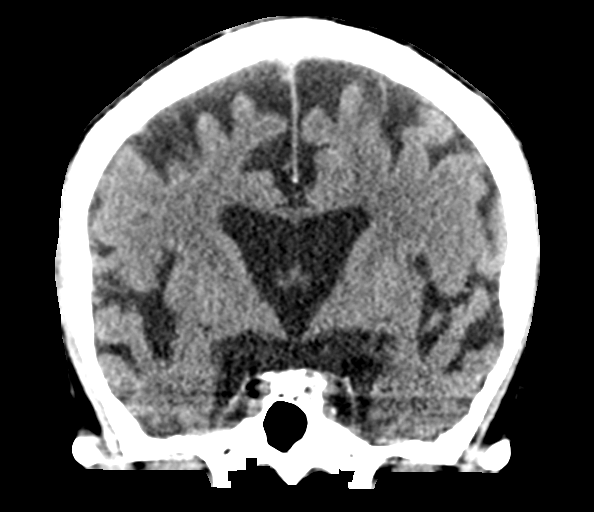

[Series 5: sagittal soft tissue · sagittal · 0.34mm/px · 3 of 58 slices shown]
[im 20/58  brain]
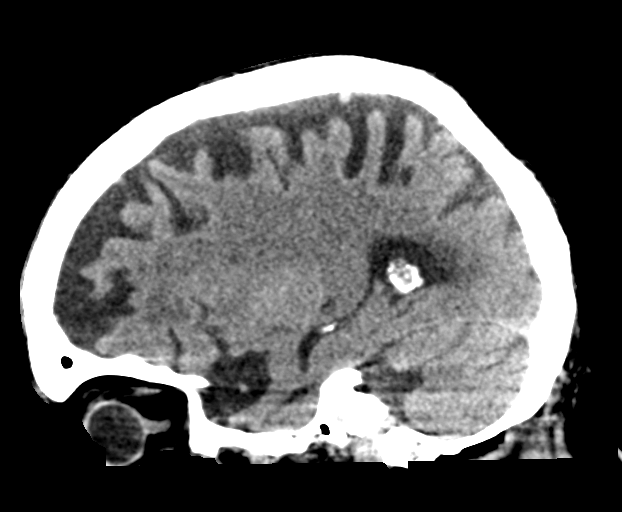
[im 29/58  brain]
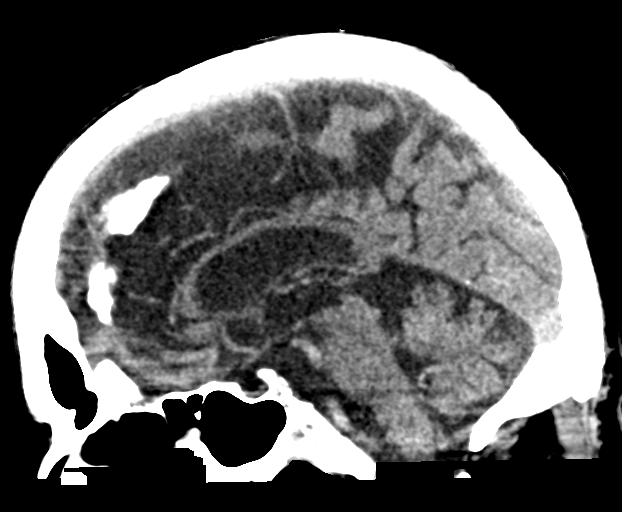
[im 39/58  brain]
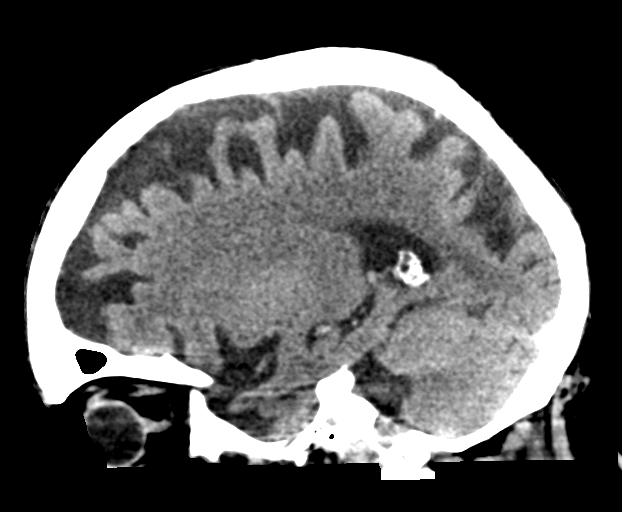

[16 of 47 positions shown; findings below may reference images not displayed]

FINDINGS: Brain: No acute intracranial abnormality. Specifically, no
hemorrhage, hydrocephalus, mass lesion, acute infarction, or
significant intracranial injury.

Vascular: No hyperdense vessel or unexpected calcification.

Skull: No acute calvarial abnormality.

Sinuses/Orbits: Visualized paranasal sinuses and mastoids clear.
Orbital soft tissues unremarkable.

Other: none
IMPRESSION: Atrophy, chronic microvascular disease.

No acute intracranial abnormality.
